# Patient Record
Sex: Male | Born: 1970 | Race: White | Hispanic: No | Marital: Married | Smoking: Never smoker
Health system: Southern US, Community
[De-identification: ages and names within clinical notes are randomized; demographics above are authoritative.]

## PROBLEM LIST (undated history)

## (undated) DIAGNOSIS — D126 Benign neoplasm of colon, unspecified: Secondary | ICD-10-CM

## (undated) DIAGNOSIS — F32A Depression, unspecified: Secondary | ICD-10-CM

## (undated) DIAGNOSIS — F329 Major depressive disorder, single episode, unspecified: Secondary | ICD-10-CM

## (undated) DIAGNOSIS — K4090 Unilateral inguinal hernia, without obstruction or gangrene, not specified as recurrent: Secondary | ICD-10-CM

## (undated) DIAGNOSIS — M1990 Unspecified osteoarthritis, unspecified site: Secondary | ICD-10-CM

## (undated) DIAGNOSIS — F419 Anxiety disorder, unspecified: Secondary | ICD-10-CM

## (undated) DIAGNOSIS — I6783 Posterior reversible encephalopathy syndrome: Principal | ICD-10-CM

## (undated) HISTORY — DX: Posterior reversible encephalopathy syndrome: I67.83

## (undated) HISTORY — DX: Unspecified osteoarthritis, unspecified site: M19.90

## (undated) HISTORY — PX: KNEE ARTHROSCOPY: SUR90

## (undated) HISTORY — DX: Depression, unspecified: F32.A

## (undated) HISTORY — DX: Unilateral inguinal hernia, without obstruction or gangrene, not specified as recurrent: K40.90

## (undated) HISTORY — DX: Benign neoplasm of colon, unspecified: D12.6

## (undated) HISTORY — DX: Anxiety disorder, unspecified: F41.9

## (undated) HISTORY — DX: Major depressive disorder, single episode, unspecified: F32.9

---
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
## 1993-07-21 DIAGNOSIS — Z9889 Other specified postprocedural states: Secondary | ICD-10-CM

## 2004-07-24 ENCOUNTER — Ambulatory Visit: Payer: Self-pay | Admitting: Internal Medicine

## 2004-08-07 ENCOUNTER — Encounter: Admission: RE | Admit: 2004-08-07 | Discharge: 2004-08-07 | Payer: Self-pay | Admitting: Orthopedic Surgery

## 2004-08-08 ENCOUNTER — Ambulatory Visit (HOSPITAL_COMMUNITY): Admission: RE | Admit: 2004-08-08 | Discharge: 2004-08-08 | Payer: Self-pay | Admitting: Orthopedic Surgery

## 2004-08-26 ENCOUNTER — Ambulatory Visit: Payer: Self-pay | Admitting: Internal Medicine

## 2004-09-24 ENCOUNTER — Ambulatory Visit: Payer: Self-pay | Admitting: Internal Medicine

## 2004-09-26 ENCOUNTER — Encounter: Payer: Self-pay | Admitting: Orthopedic Surgery

## 2004-11-28 ENCOUNTER — Ambulatory Visit: Payer: Self-pay | Admitting: Internal Medicine

## 2004-12-20 ENCOUNTER — Ambulatory Visit: Payer: Self-pay | Admitting: Internal Medicine

## 2005-01-22 ENCOUNTER — Ambulatory Visit: Payer: Self-pay | Admitting: Internal Medicine

## 2005-03-25 ENCOUNTER — Ambulatory Visit: Payer: Self-pay | Admitting: Internal Medicine

## 2005-04-01 ENCOUNTER — Ambulatory Visit: Payer: Self-pay | Admitting: Internal Medicine

## 2005-04-03 ENCOUNTER — Ambulatory Visit: Payer: Self-pay | Admitting: Internal Medicine

## 2005-04-03 ENCOUNTER — Emergency Department (HOSPITAL_COMMUNITY): Admission: EM | Admit: 2005-04-03 | Discharge: 2005-04-03 | Payer: Self-pay | Admitting: Emergency Medicine

## 2005-04-08 ENCOUNTER — Ambulatory Visit: Payer: Self-pay | Admitting: Internal Medicine

## 2005-04-15 ENCOUNTER — Ambulatory Visit: Payer: Self-pay | Admitting: Internal Medicine

## 2005-04-22 ENCOUNTER — Ambulatory Visit: Payer: Self-pay | Admitting: Internal Medicine

## 2005-05-28 ENCOUNTER — Ambulatory Visit: Payer: Self-pay | Admitting: Internal Medicine

## 2005-06-10 ENCOUNTER — Ambulatory Visit: Payer: Self-pay | Admitting: Internal Medicine

## 2005-06-24 ENCOUNTER — Ambulatory Visit: Payer: Self-pay | Admitting: Internal Medicine

## 2005-07-08 ENCOUNTER — Ambulatory Visit: Payer: Self-pay | Admitting: Internal Medicine

## 2005-07-23 ENCOUNTER — Ambulatory Visit: Payer: Self-pay | Admitting: Internal Medicine

## 2005-09-18 ENCOUNTER — Ambulatory Visit: Payer: Self-pay | Admitting: Family Medicine

## 2005-10-30 ENCOUNTER — Ambulatory Visit: Payer: Self-pay | Admitting: Internal Medicine

## 2005-12-26 ENCOUNTER — Ambulatory Visit: Payer: Self-pay | Admitting: Internal Medicine

## 2006-01-02 ENCOUNTER — Ambulatory Visit: Payer: Self-pay | Admitting: Internal Medicine

## 2006-01-08 ENCOUNTER — Ambulatory Visit: Payer: Self-pay | Admitting: Internal Medicine

## 2006-01-15 ENCOUNTER — Ambulatory Visit: Payer: Self-pay | Admitting: Internal Medicine

## 2006-02-06 ENCOUNTER — Ambulatory Visit: Payer: Self-pay | Admitting: Internal Medicine

## 2006-03-17 ENCOUNTER — Ambulatory Visit: Payer: Self-pay | Admitting: Internal Medicine

## 2006-04-10 ENCOUNTER — Encounter: Admission: RE | Admit: 2006-04-10 | Discharge: 2006-04-10 | Payer: Self-pay | Admitting: Orthopedic Surgery

## 2006-05-28 ENCOUNTER — Ambulatory Visit: Payer: Self-pay | Admitting: Internal Medicine

## 2006-06-26 ENCOUNTER — Ambulatory Visit: Payer: Self-pay | Admitting: Family Medicine

## 2006-08-04 ENCOUNTER — Encounter: Admission: RE | Admit: 2006-08-04 | Discharge: 2006-08-04 | Payer: Self-pay | Admitting: Orthopedic Surgery

## 2006-09-19 DIAGNOSIS — F3289 Other specified depressive episodes: Secondary | ICD-10-CM | POA: Insufficient documentation

## 2006-09-19 DIAGNOSIS — F329 Major depressive disorder, single episode, unspecified: Secondary | ICD-10-CM

## 2006-09-28 ENCOUNTER — Ambulatory Visit: Payer: Self-pay | Admitting: Internal Medicine

## 2006-10-22 ENCOUNTER — Encounter: Admission: RE | Admit: 2006-10-22 | Discharge: 2006-10-22 | Payer: Self-pay | Admitting: Orthopedic Surgery

## 2006-12-10 ENCOUNTER — Telehealth (INDEPENDENT_AMBULATORY_CARE_PROVIDER_SITE_OTHER): Payer: Self-pay | Admitting: *Deleted

## 2006-12-29 ENCOUNTER — Ambulatory Visit: Payer: Self-pay | Admitting: Family Medicine

## 2006-12-29 DIAGNOSIS — K625 Hemorrhage of anus and rectum: Secondary | ICD-10-CM

## 2007-01-11 ENCOUNTER — Telehealth (INDEPENDENT_AMBULATORY_CARE_PROVIDER_SITE_OTHER): Payer: Self-pay | Admitting: *Deleted

## 2007-01-19 HISTORY — PX: WRIST SURGERY: SHX841

## 2007-01-21 ENCOUNTER — Encounter (INDEPENDENT_AMBULATORY_CARE_PROVIDER_SITE_OTHER): Payer: Self-pay | Admitting: *Deleted

## 2007-02-01 ENCOUNTER — Ambulatory Visit: Payer: Self-pay | Admitting: Internal Medicine

## 2007-02-09 ENCOUNTER — Telehealth (INDEPENDENT_AMBULATORY_CARE_PROVIDER_SITE_OTHER): Payer: Self-pay | Admitting: *Deleted

## 2007-03-19 ENCOUNTER — Ambulatory Visit: Payer: Self-pay | Admitting: Gastroenterology

## 2007-03-19 LAB — CONVERTED CEMR LAB
ALT: 18 units/L (ref 0–53)
AST: 21 units/L (ref 0–37)
Basophils Relative: 0.8 % (ref 0.0–1.0)
Bilirubin, Direct: 0.1 mg/dL (ref 0.0–0.3)
Calcium: 10 mg/dL (ref 8.4–10.5)
Chloride: 103 meq/L (ref 96–112)
Creatinine, Ser: 0.8 mg/dL (ref 0.4–1.5)
Eosinophils Relative: 1.4 % (ref 0.0–5.0)
Glucose, Bld: 100 mg/dL — ABNORMAL HIGH (ref 70–99)
HCT: 40.6 % (ref 39.0–52.0)
Neutrophils Relative %: 48.3 % (ref 43.0–77.0)
Platelets: 354 10*3/uL (ref 150–400)
RBC: 4.53 M/uL (ref 4.22–5.81)
RDW: 12.8 % (ref 11.5–14.6)
Sodium: 140 meq/L (ref 135–145)
TSH: 0.77 microintl units/mL (ref 0.35–5.50)
Total Bilirubin: 0.6 mg/dL (ref 0.3–1.2)
WBC: 7.3 10*3/uL (ref 4.5–10.5)

## 2007-04-23 ENCOUNTER — Ambulatory Visit: Payer: Self-pay | Admitting: Gastroenterology

## 2007-04-23 ENCOUNTER — Encounter (INDEPENDENT_AMBULATORY_CARE_PROVIDER_SITE_OTHER): Payer: Self-pay | Admitting: Internal Medicine

## 2007-04-23 ENCOUNTER — Encounter: Payer: Self-pay | Admitting: Gastroenterology

## 2007-05-03 ENCOUNTER — Encounter: Payer: Self-pay | Admitting: Internal Medicine

## 2007-05-12 ENCOUNTER — Telehealth (INDEPENDENT_AMBULATORY_CARE_PROVIDER_SITE_OTHER): Payer: Self-pay | Admitting: *Deleted

## 2007-07-08 ENCOUNTER — Telehealth (INDEPENDENT_AMBULATORY_CARE_PROVIDER_SITE_OTHER): Payer: Self-pay | Admitting: *Deleted

## 2007-07-30 ENCOUNTER — Emergency Department: Payer: Self-pay | Admitting: Emergency Medicine

## 2007-08-16 ENCOUNTER — Telehealth (INDEPENDENT_AMBULATORY_CARE_PROVIDER_SITE_OTHER): Payer: Self-pay | Admitting: *Deleted

## 2007-09-14 ENCOUNTER — Telehealth (INDEPENDENT_AMBULATORY_CARE_PROVIDER_SITE_OTHER): Payer: Self-pay | Admitting: *Deleted

## 2007-10-14 ENCOUNTER — Telehealth (INDEPENDENT_AMBULATORY_CARE_PROVIDER_SITE_OTHER): Payer: Self-pay | Admitting: *Deleted

## 2007-10-15 DIAGNOSIS — D126 Benign neoplasm of colon, unspecified: Secondary | ICD-10-CM

## 2007-11-15 ENCOUNTER — Telehealth (INDEPENDENT_AMBULATORY_CARE_PROVIDER_SITE_OTHER): Payer: Self-pay | Admitting: *Deleted

## 2007-12-15 ENCOUNTER — Telehealth (INDEPENDENT_AMBULATORY_CARE_PROVIDER_SITE_OTHER): Payer: Self-pay | Admitting: *Deleted

## 2008-02-14 ENCOUNTER — Telehealth (INDEPENDENT_AMBULATORY_CARE_PROVIDER_SITE_OTHER): Payer: Self-pay | Admitting: *Deleted

## 2008-04-14 ENCOUNTER — Telehealth: Payer: Self-pay | Admitting: Internal Medicine

## 2008-04-24 ENCOUNTER — Telehealth: Payer: Self-pay | Admitting: Gastroenterology

## 2008-04-24 ENCOUNTER — Ambulatory Visit: Payer: Self-pay | Admitting: Family Medicine

## 2008-04-25 ENCOUNTER — Ambulatory Visit: Payer: Self-pay | Admitting: Gastroenterology

## 2008-04-25 LAB — CONVERTED CEMR LAB
ALT: 22 units/L (ref 0–53)
Albumin: 4.3 g/dL (ref 3.5–5.2)
Alkaline Phosphatase: 46 units/L (ref 39–117)
Basophils Absolute: 0 10*3/uL (ref 0.0–0.1)
Glucose, Bld: 101 mg/dL — ABNORMAL HIGH (ref 70–99)
HCT: 39.5 % (ref 39.0–52.0)
Hemoglobin: 13.7 g/dL (ref 13.0–17.0)
Lymphocytes Relative: 33.8 % (ref 12.0–46.0)
MCHC: 34.8 g/dL (ref 30.0–36.0)
Monocytes Absolute: 0.5 10*3/uL (ref 0.1–1.0)
Monocytes Relative: 7.9 % (ref 3.0–12.0)
Neutro Abs: 3.6 10*3/uL (ref 1.4–7.7)
Platelets: 330 10*3/uL (ref 150–400)
Potassium: 3.8 meq/L (ref 3.5–5.1)
RDW: 12.8 % (ref 11.5–14.6)
Sodium: 140 meq/L (ref 135–145)
Total Bilirubin: 0.8 mg/dL (ref 0.3–1.2)
Total Protein: 7.3 g/dL (ref 6.0–8.3)

## 2008-05-10 ENCOUNTER — Ambulatory Visit: Payer: Self-pay | Admitting: Internal Medicine

## 2008-05-10 DIAGNOSIS — S335XXA Sprain of ligaments of lumbar spine, initial encounter: Secondary | ICD-10-CM

## 2008-06-08 ENCOUNTER — Telehealth: Payer: Self-pay | Admitting: Internal Medicine

## 2008-07-07 ENCOUNTER — Telehealth: Payer: Self-pay | Admitting: Internal Medicine

## 2008-07-12 ENCOUNTER — Telehealth (INDEPENDENT_AMBULATORY_CARE_PROVIDER_SITE_OTHER): Payer: Self-pay | Admitting: *Deleted

## 2008-07-12 ENCOUNTER — Ambulatory Visit: Payer: Self-pay | Admitting: Family Medicine

## 2008-07-12 DIAGNOSIS — J069 Acute upper respiratory infection, unspecified: Secondary | ICD-10-CM | POA: Insufficient documentation

## 2008-08-16 ENCOUNTER — Ambulatory Visit: Payer: Self-pay | Admitting: Family Medicine

## 2008-08-28 ENCOUNTER — Telehealth: Payer: Self-pay | Admitting: Internal Medicine

## 2008-08-30 ENCOUNTER — Ambulatory Visit: Payer: Self-pay | Admitting: Internal Medicine

## 2008-09-14 ENCOUNTER — Ambulatory Visit: Payer: Self-pay | Admitting: Internal Medicine

## 2008-09-25 ENCOUNTER — Telehealth: Payer: Self-pay | Admitting: Internal Medicine

## 2008-10-06 ENCOUNTER — Ambulatory Visit: Payer: Self-pay | Admitting: Internal Medicine

## 2008-10-17 ENCOUNTER — Telehealth: Payer: Self-pay | Admitting: Internal Medicine

## 2008-10-18 ENCOUNTER — Encounter: Payer: Self-pay | Admitting: Internal Medicine

## 2008-10-19 HISTORY — PX: KNEE ARTHROSCOPY: SUR90

## 2008-10-26 ENCOUNTER — Telehealth: Payer: Self-pay | Admitting: Internal Medicine

## 2008-11-07 ENCOUNTER — Telehealth: Payer: Self-pay | Admitting: Internal Medicine

## 2008-11-07 ENCOUNTER — Ambulatory Visit (HOSPITAL_BASED_OUTPATIENT_CLINIC_OR_DEPARTMENT_OTHER): Admission: RE | Admit: 2008-11-07 | Discharge: 2008-11-07 | Payer: Self-pay | Admitting: Orthopedic Surgery

## 2008-11-13 ENCOUNTER — Telehealth: Payer: Self-pay | Admitting: Internal Medicine

## 2008-11-21 ENCOUNTER — Telehealth: Payer: Self-pay | Admitting: Internal Medicine

## 2008-12-05 ENCOUNTER — Ambulatory Visit: Payer: Self-pay | Admitting: Internal Medicine

## 2008-12-20 ENCOUNTER — Telehealth: Payer: Self-pay | Admitting: Internal Medicine

## 2008-12-21 ENCOUNTER — Telehealth: Payer: Self-pay | Admitting: Internal Medicine

## 2009-01-19 ENCOUNTER — Telehealth: Payer: Self-pay | Admitting: Family Medicine

## 2009-02-02 ENCOUNTER — Telehealth: Payer: Self-pay | Admitting: Internal Medicine

## 2009-02-16 ENCOUNTER — Telehealth: Payer: Self-pay | Admitting: Internal Medicine

## 2009-03-06 ENCOUNTER — Telehealth: Payer: Self-pay | Admitting: Internal Medicine

## 2009-03-19 ENCOUNTER — Telehealth: Payer: Self-pay | Admitting: Internal Medicine

## 2009-04-18 ENCOUNTER — Ambulatory Visit: Payer: Self-pay | Admitting: Internal Medicine

## 2009-04-18 LAB — CONVERTED CEMR LAB: Rapid Strep: NEGATIVE

## 2009-04-19 ENCOUNTER — Telehealth: Payer: Self-pay | Admitting: Internal Medicine

## 2009-05-17 ENCOUNTER — Telehealth: Payer: Self-pay | Admitting: Internal Medicine

## 2009-06-18 ENCOUNTER — Ambulatory Visit: Payer: Self-pay | Admitting: Internal Medicine

## 2009-06-18 DIAGNOSIS — F411 Generalized anxiety disorder: Secondary | ICD-10-CM | POA: Insufficient documentation

## 2009-06-18 DIAGNOSIS — M1990 Unspecified osteoarthritis, unspecified site: Secondary | ICD-10-CM | POA: Insufficient documentation

## 2009-06-27 ENCOUNTER — Telehealth: Payer: Self-pay | Admitting: Internal Medicine

## 2009-07-26 ENCOUNTER — Telehealth: Payer: Self-pay | Admitting: Family Medicine

## 2009-08-24 ENCOUNTER — Telehealth: Payer: Self-pay | Admitting: Family Medicine

## 2009-09-25 ENCOUNTER — Telehealth: Payer: Self-pay | Admitting: Internal Medicine

## 2009-09-26 ENCOUNTER — Encounter: Payer: Self-pay | Admitting: Internal Medicine

## 2009-11-12 ENCOUNTER — Telehealth: Payer: Self-pay | Admitting: Internal Medicine

## 2009-12-12 ENCOUNTER — Telehealth: Payer: Self-pay | Admitting: Internal Medicine

## 2010-01-14 ENCOUNTER — Telehealth: Payer: Self-pay | Admitting: Internal Medicine

## 2010-02-13 ENCOUNTER — Telehealth: Payer: Self-pay | Admitting: Internal Medicine

## 2010-03-15 ENCOUNTER — Telehealth: Payer: Self-pay | Admitting: Family Medicine

## 2010-04-15 ENCOUNTER — Telehealth: Payer: Self-pay | Admitting: Internal Medicine

## 2010-04-22 ENCOUNTER — Ambulatory Visit: Payer: Self-pay | Admitting: Internal Medicine

## 2010-04-22 DIAGNOSIS — R5381 Other malaise: Secondary | ICD-10-CM

## 2010-04-22 DIAGNOSIS — R5383 Other fatigue: Secondary | ICD-10-CM

## 2010-04-24 LAB — CONVERTED CEMR LAB
Albumin: 4.8 g/dL (ref 3.5–5.2)
Alkaline Phosphatase: 48 units/L (ref 39–117)
BUN: 10 mg/dL (ref 6–23)
Basophils Relative: 0.5 % (ref 0.0–3.0)
CO2: 28 meq/L (ref 19–32)
Calcium: 9.8 mg/dL (ref 8.4–10.5)
Chloride: 101 meq/L (ref 96–112)
Eosinophils Absolute: 0.1 10*3/uL (ref 0.0–0.7)
HCT: 43.2 % (ref 39.0–52.0)
Hemoglobin: 15 g/dL (ref 13.0–17.0)
Lymphocytes Relative: 20.2 % (ref 12.0–46.0)
Lymphs Abs: 2.5 10*3/uL (ref 0.7–4.0)
MCHC: 34.8 g/dL (ref 30.0–36.0)
MCV: 90.7 fL (ref 78.0–100.0)
Neutro Abs: 9.2 10*3/uL — ABNORMAL HIGH (ref 1.4–7.7)
Potassium: 4.1 meq/L (ref 3.5–5.1)
RBC: 4.76 M/uL (ref 4.22–5.81)
RDW: 13.7 % (ref 11.5–14.6)
TSH: 0.81 microintl units/mL (ref 0.35–5.50)
Testosterone Free: 56.5 pg/mL (ref 47.0–244.0)
Testosterone: 175.04 ng/dL — ABNORMAL LOW (ref 350–890)
Total Protein: 8 g/dL (ref 6.0–8.3)

## 2010-05-15 ENCOUNTER — Telehealth: Payer: Self-pay | Admitting: Internal Medicine

## 2010-05-23 ENCOUNTER — Ambulatory Visit: Payer: Self-pay | Admitting: Internal Medicine

## 2010-05-30 ENCOUNTER — Telehealth: Payer: Self-pay | Admitting: Internal Medicine

## 2010-06-24 ENCOUNTER — Ambulatory Visit: Payer: Self-pay | Admitting: Internal Medicine

## 2010-07-16 ENCOUNTER — Telehealth (INDEPENDENT_AMBULATORY_CARE_PROVIDER_SITE_OTHER): Payer: Self-pay | Admitting: *Deleted

## 2010-08-07 ENCOUNTER — Telehealth: Payer: Self-pay | Admitting: Internal Medicine

## 2010-08-12 ENCOUNTER — Telehealth: Payer: Self-pay | Admitting: Internal Medicine

## 2010-08-21 NOTE — Assessment & Plan Note (Signed)
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 * Pneamatussin  ?? 2)  Buspar 3)  Effexor 4)  Fluoxetine Hcl (Fluoxetine Hcl) 5)  Zyprexa (Olanzapine)  Past History:  Past medical, surgical, family and social histories (including risk factors) reviewed for relevance to current acute and chronic problems.  Past Medical History: Reviewed history from 06/18/2009 and no changes required. Depression Adenomatous colon polyp Anxiety Osteoarthritis  Past Surgical History: Reviewed history from 12/05/2008 and no changes required. [id number] Inguinal herniorrhaphy-R--1992 R wrist surgery 7/08 Right knee arthroscopy  ~1995 & 2005 Left knee arthroscopy 4/10   Dr Leslee Home  Family History: Reviewed history from 06/18/2009 and no changes required. Biologic dad with prostate cancer Brother with Hodgkin's lymphoma Pat uncle with colon cancer ??Dennie Bible GF died of  MI  Social History: Reviewed history from 06/18/2009 and no changes required. Divorced.---1 son Remarried 8/10---2 stepchildren Plumber now--former Emergency planning/management officer Never Smoked  Review of Systems  The patient denies chest pain, syncope, dyspnea on exertion, and abdominal pain.         No apparent fever weight is up 15# since almost a year ago tries to walk regularly if he has the energy--- still physically active at work No change in bowels Knee pain is about the same--uses knee pads and the hydrocodone  Physical Exam  General:  alert and normal appearance.   Mouth:  no erythema, no exudates, and no lesions.   Neck:  supple, no masses, no thyromegaly, no carotid bruits, and no cervical lymphadenopathy.   Lungs:  normal respiratory effort, no intercostal retractions, no accessory muscle use, and normal breath sounds.   Heart:  normal rate, regular rhythm, no murmur, and no gallop.   Abdomen:  soft, non-tender, no masses, no hepatomegaly, and no splenomegaly.   Msk:  no joint tenderness and no joint swelling.   Pulses:  1+ in feet Extremities:  no sig edema Neurologic:  alert & oriented X3, strength normal in all extremities, and gait normal.   Skin:  no rashes and no suspicious lesions.   Axillary Nodes:  No palpable lymphadenopathy Inguinal Nodes:  No significant adenopathy Psych:  normally interactive, good eye contact, not anxious appearing, and not depressed appearing.     Impression & Recommendations:  Problem # 1:  FATIGUE (ICD-780.79) Assessment New  non specific doesn't appear to be affective but still a possibliity did respond to testosterone in 2004----  may have been placebo effect sleeps enough without apnea PE is reassuring  P: check labs    recheck soon    try to increase walking  Orders: Venipuncture (34742) TLB-Renal Function Panel (80069-RENAL) TLB-CBC Platelet - w/Differential (85025-CBCD) TLB-Hepatic/Liver Function Pnl (80076-HEPATIC) TLB-TSH  (Thyroid Stimulating Hormone) (84443-TSH) Specimen Handling (59563) T- * Misc. Laboratory test [id number])  Complete Medication List: 1)  Lidoderm 5 % Ptch (Lidocaine) .... Apply 1 patch to skin at bedtime 2)  Multivitamins Tabs (Multiple vitamin) .... Take 1 tablet by mouth once a day 3)  Paroxetine Hcl 20 Mg Tabs (Paroxetine hcl) .... One by mouth daily 4)  Hydrocodone-acetaminophen 5-500 Mg Tabs (Hydrocodone-acetaminophen) .Marland Kitchen.. 1-2  three times a day as needed for severe knee pain 5)  Aspirin 81 Mg Tabs (Aspirin) .... Take 1 by mouth once daily 6)  Effexor Xr 75 Mg Xr24h-cap (Venlafaxine hcl) .... Take 1 by mouth two times a day daw1 7)  Vitamin B-12 1000 Mcg Tabs (Cyanocobalamin) .... Take 1 by mouth once daily  Patient Instructions: 1)  Please schedule a follow-up appointment in 1 month.   Current Allergies (reviewed today): ! * PNEAMATUSSIN  ?? BUSPAR EFFEXOR FLUOXETINE HCL (FLUOXETINE HCL) ZYPREXA (OLANZAPINE)

## 2010-08-21 NOTE — Progress Notes (Signed)
Summary: fentanyl patches   Phone Note Call from Patient Call back at Home Phone [phone]   Caller: Patient Call For: Cindee Salt MD Summary of Call: Patient called to let you know that the fentanyl patches seem to be helping, the only problem he is having is he can only get the patches to stay on for about two days when they are supposed to last 3 days. He says that he has tried using medical tape to help hold them on and that doesn't help either. He says that with working outside alot they are just not going to last 3 days each. Please advise.  Uses [facility] if needed.  Initial call taken by: Melody Comas,  May 30, 2010 10:02 AM  Follow-up for Phone Call        we can change the patches to be applied every 2 days  I will write a new prescription which he can pick up Please have him keep the follow up appt Follow-up by: Cindee Salt MD,  May 30, 2010 12:44 PM  Additional Follow-up for Phone Call Additional follow up Details #1::        Spoke with patient and advised rx ready for pick-up  Additional Follow-up by: Mervin Hack CMA [facility]),  May 30, 2010 2:46 PM    New/Updated Medications: FENTANYL 25 MCG/HR PT72 (FENTANYL) Apply 1 patch every 2 days for pain control Prescriptions: FENTANYL 25 MCG/HR PT72 (FENTANYL) Apply 1 patch every 2 days for pain control  #15 x 0   Entered and Authorized by:   Cindee Salt MD   Signed by:   Cindee Salt MD on 05/30/2010   Method used:   Print then Give to Patient   RxID:   [id number]

## 2010-08-21 NOTE — Progress Notes (Signed)
Summary: Hydrocodone/APAP  Phone Note Refill Request Message from:  Fax from Pharmacy on August 24, 2009 10:11 AM  Refills Requested: Medication #1:  HYDROCODONE-ACETAMINOPHEN 5-500 MG TABS 1-2  three times a day as needed for severe knee pain [facility]  Phone:   [phone]   Method Requested: Telephone to Pharmacy Initial call taken by: Delilah Shan CMA [facility]),  August 24, 2009 10:11 AM  Follow-up for Phone Call        Medication phoned to pharmacy.  Follow-up by: Delilah Shan CMA (AAMA),  August 24, 2009 11:11 AM    Prescriptions: HYDROCODONE-ACETAMINOPHEN 5-500 MG TABS (HYDROCODONE-ACETAMINOPHEN) 1-2  three times a day as needed for severe knee pain  #90 x 1   Entered and Authorized by:   Ruthe Mannan MD   Signed by:   Ruthe Mannan MD on 08/24/2009   Method used:   Handwritten   RxID:   [id number]

## 2010-08-21 NOTE — Progress Notes (Signed)
Summary: refill request for vicodin  Phone Note Refill Request Message from:  Fax from Pharmacy  Refills Requested: Medication #1:  HYDROCODONE-ACETAMINOPHEN 5-500 MG TABS 1-2  three times a day as needed for severe knee pain   Last Refilled: 11/12/2009 Faxed request from [facility] is on your desk.  Initial call taken by: Lowella Petties CMA,  Dec 12, 2009 8:48 AM  Follow-up for Phone Call        okay #135 x 0 Follow-up by: Cindee Salt MD,  Dec 12, 2009 1:50 PM  Additional Follow-up for Phone Call Additional follow up Details #1::        Rx faxed to pharmacy Additional Follow-up by: DeShannon Smith CMA [facility]),  Dec 12, 2009 4:21 PM    Prescriptions: HYDROCODONE-ACETAMINOPHEN 5-500 MG TABS (HYDROCODONE-ACETAMINOPHEN) 1-2  three times a day as needed for severe knee pain  #135 x 0   Entered by:   Mervin Hack CMA (AAMA)   Authorized by:   Cindee Salt MD   Signed by:   Mervin Hack CMA (AAMA) on 12/12/2009   Method used:   Handwritten   RxID:   [id number]

## 2010-08-21 NOTE — Progress Notes (Signed)
Summary: vicodin   Phone Note Refill Request Message from:  Fax from Pharmacy on April 15, 2010 8:34 AM  Refills Requested: Medication #1:  HYDROCODONE-ACETAMINOPHEN 5-500 MG TABS 1-2  three times a day as needed for severe knee pain   Last Refilled: 03/15/2010 Refill request from [facility]. Form is on your desk.   Initial call taken by: Melody Comas,  April 15, 2010 8:34 AM  Follow-up for Phone Call        Okay #135 x 0 Follow-up by: Cindee Salt MD,  April 15, 2010 1:32 PM  Additional Follow-up for Phone Call Additional follow up Details #1::        Completed rx form faxed back to pharmacy. Additional Follow-up by: Sydell Axon LPN,  April 15, 2010 2:44 PM    Prescriptions: HYDROCODONE-ACETAMINOPHEN 5-500 MG TABS (HYDROCODONE-ACETAMINOPHEN) 1-2  three times a day as needed for severe knee pain  #135 x 0   Entered by:   Sydell Axon LPN   Authorized by:   Cindee Salt MD   Signed by:   Sydell Axon LPN on 64/40/3474   Method used:   Telephoned to ...       [facility]* (retail)       [address]       [place], [place]  [address]       Ph: [phone]       Fax: [phone]   RxID:   [id number]

## 2010-08-21 NOTE — Progress Notes (Signed)
Summary: hydrcodone  Phone Note Refill Request Message from:  Fax from Pharmacy on May 15, 2010 8:55 AM  Refills Requested: Medication #1:  HYDROCODONE-ACETAMINOPHEN 5-500 MG TABS 1-2  three times a day as needed for severe knee pain   Last Refilled: 04/15/2010 Refill request from [facility]. [phone].  Initial call taken by: Melody Comas,  May 15, 2010 8:56 AM  Follow-up for Phone Call        okay #135 x 0 Follow-up by: Cindee Salt MD,  May 15, 2010 1:44 PM  Additional Follow-up for Phone Call Additional follow up Details #1::        Rx called to pharmacy Additional Follow-up by: DeShannon Smith CMA [facility]),  May 15, 2010 3:12 PM    Prescriptions: HYDROCODONE-ACETAMINOPHEN 5-500 MG TABS (HYDROCODONE-ACETAMINOPHEN) 1-2  three times a day as needed for severe knee pain  #135 x 0   Entered by:   Mervin Hack CMA (AAMA)   Authorized by:   Cindee Salt MD   Signed by:   Mervin Hack CMA (AAMA) on 05/15/2010   Method used:   Telephoned to ...       [facility]* (retail)       [address]       [place], [place]  [address]       Ph: [phone]       Fax: [phone]   RxID:   [id number]

## 2010-08-21 NOTE — Progress Notes (Signed)
Summary: Rx Hydrocodone  Phone Note Refill Request Call back at [phone] Message from:  [facility] on November 12, 2009 9:37 AM  Refills Requested: Medication #1:  HYDROCODONE-ACETAMINOPHEN 5-500 MG TABS 1-2  three times a day as needed for severe knee pain   Last Refilled: 10/11/2009 Received faxed refill request, form in your IN box.   Method Requested: Fax to Local Pharmacy Initial call taken by: Linde Gillis CMA [facility]),  November 12, 2009 9:38 AM  Follow-up for Phone Call        okay #135 x 0 Follow-up by: Cindee Salt MD,  November 12, 2009 1:08 PM  Additional Follow-up for Phone Call Additional follow up Details #1::        Rx faxed to pharmacy Additional Follow-up by: DeShannon Smith CMA [facility]),  November 12, 2009 2:17 PM    Prescriptions: HYDROCODONE-ACETAMINOPHEN 5-500 MG TABS (HYDROCODONE-ACETAMINOPHEN) 1-2  three times a day as needed for severe knee pain  #135 x 0   Entered by:   Mervin Hack CMA (AAMA)   Authorized by:   Cindee Salt MD   Signed by:   Mervin Hack CMA (AAMA) on 11/12/2009   Method used:   Handwritten   RxID:   [id number]

## 2010-08-21 NOTE — Assessment & Plan Note (Signed)
Summary: 1 M F/U DLO   Vital Signs:  Patient profile:   39 year old male Weight:      236 pounds Temp:     98.3 degrees F oral BP sitting:   118 / 60  (left arm) Cuff size:   large  Vitals Entered By: Mervin Hack CMA [facility]) (June 24, 2010 4:05 PM) CC: 1 month follow-up   History of Present Illness: patch is really helping Burden of pain is down considerably--"I can deal with it better" Has decreased the hydrocodone to  ~ 2 per day Tends to take 1/2 at various times  No sedation No stomach trouble Slight nausea at first when started--- realized it was from the vitamin supplements   Allergies: 1)  ! * Pneamatussin  ?? 2)  Buspar 3)  Effexor 4)  Fluoxetine Hcl (Fluoxetine Hcl) 5)  Zyprexa (Olanzapine)  Past History:  Past medical, surgical, family and social histories (including risk factors) reviewed for relevance to current acute and chronic problems.  Past Medical History: Reviewed history from 06/18/2009 and no changes required. Depression Adenomatous colon polyp Anxiety Osteoarthritis  Past Surgical History: Reviewed history from 12/05/2008 and no changes required. [id number] Inguinal herniorrhaphy-R--1992 R wrist surgery 7/08 Right knee arthroscopy  ~1995 & 2005 Left knee arthroscopy 4/10   Dr Leslee Home  Family History: Reviewed history from 06/18/2009 and no changes required. Biologic dad with prostate cancer Brother with Hodgkin's lymphoma Pat uncle with colon cancer ??Dennie Bible GF died of MI  Social History: Reviewed history from 06/18/2009 and no changes required. 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 Apply 1 patch every 2 days for pain control    Hydrocodone-acetaminophen 5-500 Mg Tabs (Hydrocodone-acetaminophen) .Marland Kitchen... 1  three times a day as needed for severe knee pain    Aspirin 81 Mg Tabs (Aspirin) .Marland Kitchen... Take 1 by mouth once daily  Complete Medication List: 1)  Fentanyl 25 Mcg/hr Pt72 (Fentanyl) .... Apply 1 patch every 2 days for pain control 2)  Paroxetine Hcl 20 Mg Tabs (Paroxetine hcl) .... One by mouth daily 3)  Hydrocodone-acetaminophen 5-500 Mg Tabs (Hydrocodone-acetaminophen) .Marland Kitchen.. 1  three times a day as needed for severe knee pain 4)  Effexor Xr 75 Mg Xr24h-cap (Venlafaxine hcl) .... Take 1 by mouth two times a day daw1 5)  Lidoderm 5 % Ptch (Lidocaine) .... Apply 1 patch to skin at bedtime 6)  Aspirin 81 Mg Tabs (Aspirin) .... Take 1 by mouth once daily 7)  Vitamin B-12 1000 Mcg Tabs (Cyanocobalamin) .... Take 1 by mouth once daily  Patient Instructions: 1)  Please schedule a follow-up appointment in 4-6  months for physical Prescriptions: HYDROCODONE-ACETAMINOPHEN 5-500 MG TABS (HYDROCODONE-ACETAMINOPHEN) 1  three times a day as needed for severe knee pain  #90 x 0   Entered and Authorized by:   Cindee Salt MD   Signed by:   Cindee Salt MD on 06/24/2010   Method used:   Print then Give to Patient   RxID:  [id number] FENTANYL 25 MCG/HR PT72 (FENTANYL) Apply 1 patch every 2 days for pain control  #15 x 0   Entered and Authorized by:   Cindee Salt MD   Signed by:   Cindee Salt MD on 06/24/2010   Method used:   Print then Give to Patient   RxID:   [id number] EFFEXOR XR 75 MG XR24H-CAP (VENLAFAXINE HCL) take 1 by mouth two times a day DAW1  #60 x 12   Entered by:   Mervin Hack CMA (AAMA)   Authorized by:   Cindee Salt MD   Signed by:    Mervin Hack CMA (AAMA) on 06/24/2010   Method used:   Electronically to        [facility]* (retail)       [address]       [place], [place]  [address]       Ph: [phone]       Fax: [phone]   RxID:   [id number] PAROXETINE HCL 20 MG  TABS (PAROXETINE HCL) one by mouth daily  #30 x 12   Entered by:   Mervin Hack CMA (AAMA)   Authorized by:   Cindee Salt MD   Signed by:   Mervin Hack CMA (AAMA) on 06/24/2010   Method used:   Electronically to        [facility]* (retail)       [address]       [place], [place]  [address]       Ph: [phone]       Fax: [phone]   RxID:   [id number]    Orders Added: 1)  Est. Patient Level III [83151]    Current Allergies (reviewed today): ! * PNEAMATUSSIN  ?? BUSPAR EFFEXOR FLUOXETINE HCL (FLUOXETINE HCL) ZYPREXA (OLANZAPINE)

## 2010-08-21 NOTE — Progress Notes (Signed)
Summary: Hydrocodone/APAP  Phone Note Refill Request Message from:  Fax from Pharmacy on January 14, 2010 9:38 AM  Refills Requested: Medication #1:  HYDROCODONE-ACETAMINOPHEN 5-500 MG TABS 1-2  three times a day as needed for severe knee pain [facility]  Phone:   [phone]   Method Requested: Telephone to Pharmacy Initial call taken by: Delilah Shan CMA [facility]),  January 14, 2010 9:39 AM  Follow-up for Phone Call        okay #135 x 0 Follow-up by: Cindee Salt MD,  January 14, 2010 1:44 PM  Additional Follow-up for Phone Call Additional follow up Details #1::        Rx called to pharmacy Additional Follow-up by: DeShannon Katrinka Blazing CMA [facility]),  January 14, 2010 2:36 PM    Prescriptions: HYDROCODONE-ACETAMINOPHEN 5-500 MG TABS (HYDROCODONE-ACETAMINOPHEN) 1-2  three times a day as needed for severe knee pain  #135 x 0   Entered by:   Mervin Hack CMA (AAMA)   Authorized by:   Cindee Salt MD   Signed by:   Mervin Hack CMA (AAMA) on 01/14/2010   Method used:   Telephoned to ...       [facility]* (retail)       [address]       [place], [place]  [address]       Ph: [phone]       Fax: [phone]   RxID:   [id number]

## 2010-08-21 NOTE — Assessment & Plan Note (Signed)
Summary: ONE MONTH FOLLOW UP / LFW   Vital Signs:  Patient profile:   40 year old male Height:      72 inches Weight:      240.50 pounds BMI:     32.74 Temp:     97.8 degrees F oral Pulse rate:   100 / minute Pulse rhythm:   regular BP sitting:   112 / 76  (left arm) Cuff size:   large  Vitals Entered By: Lewanda Rife LPN (May 23, 2010 2:36 PM) CC: one month f/u   History of Present Illness: Feels about the same Tired all the time but he keeps up the pace  Still sleeps okay Mood has been okay--no major problems with anxiety not depressed  Feels tired but not really sleepy  will try to walk or other exercise on days off this no longer energizes him  Ongoing knee pain can get pretty bad as the day goes on hydrocodone gives relief for a while but wears off   Allergies: 1)  ! * Pneamatussin  ?? 2)  Buspar 3)  Effexor 4)  Fluoxetine Hcl (Fluoxetine Hcl) 5)  Zyprexa (Olanzapine)  Past History:  Past medical, surgical, family and social histories (including risk factors) reviewed for relevance to current acute and chronic problems.  Past Medical History: Reviewed history from 06/18/2009 and no changes required. Depression Adenomatous colon polyp Anxiety Osteoarthritis  Past Surgical History: Reviewed history from 12/05/2008 and no changes required. [id number] Inguinal herniorrhaphy-R--1992 R wrist surgery 7/08 Right knee arthroscopy  ~1995 & 2005 Left knee arthroscopy 4/10   Dr Leslee Home  Family History: Reviewed history from 06/18/2009 and no changes required. Biologic dad with prostate cancer Brother with Hodgkin's lymphoma Pat uncle with colon cancer ??Dennie Bible GF died of MI  Social History: Reviewed history from 06/18/2009 and no changes required. 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 One by mouth daily 2)  Hydrocodone-acetaminophen 5-500 Mg Tabs (Hydrocodone-acetaminophen) .Marland Kitchen.. 1-2  three times a day as needed for severe knee pain 3)  Effexor Xr 75 Mg Xr24h-cap (Venlafaxine hcl) .... Take 1 by mouth two times a day daw1 4)  Lidoderm 5 % Ptch (Lidocaine) .... Apply 1 patch to skin at bedtime 5)  Aspirin 81 Mg Tabs (Aspirin) .... Take 1 by mouth once daily 6)  Vitamin B-12 1000 Mcg Tabs (Cyanocobalamin) .... Take 1 by mouth once daily 7)  Multivitamins Tabs (Multiple vitamin) .... Take 1 tablet by mouth once a day 8)  Fentanyl 25 Mcg/hr Pt72 (Fentanyl) .... Apply 1 patch every 3 days for pain control  Other Orders: Flu Vaccine 81yrs + (14782) Admin 1st Vaccine (95621)  Patient Instructions: 1)  Please schedule a follow-up appointment in 1 month.  Prescriptions: FENTANYL 25 MCG/HR PT72 (FENTANYL) Apply 1 patch every 3 days for pain control  #10 x 0   Entered and Authorized by:   Cindee Salt MD   Signed by:  Richard Dia Crawford MD on 05/23/2010   Method used:   Print then Give to Patient   RxID:   [id number]    Orders Added: 1)  Flu Vaccine 47yrs + [36644] 2)  Admin 1st Vaccine [90471]   Immunizations Administered:  Influenza Vaccine # 1:    Vaccine Type: Fluvax 3+    Site: left deltoid    Mfr: GlaxoSmithKline    Dose: 0.5 ml    Route: IM    Given by: Mervin Hack CMA (AAMA)    Exp. Date: 01/18/2011    Lot #: [id number]    VIS given: 02/12/10 version given May 23, 2010.  Flu Vaccine Consent Questions:    Do you  have a history of severe allergic reactions to this vaccine? no    Any prior history of allergic reactions to egg and/or gelatin? no    Do you have a sensitivity to the preservative Thimersol? no    Do you have a past history of Guillan-Barre Syndrome? no    Do you currently have an acute febrile illness? no    Have you ever had a severe reaction to latex? no    Vaccine information given and explained to patient? yes   Immunizations Administered:  Influenza Vaccine # 1:    Vaccine Type: Fluvax 3+    Site: left deltoid    Mfr: GlaxoSmithKline    Dose: 0.5 ml    Route: IM    Given by: Mervin Hack CMA (AAMA)    Exp. Date: 01/18/2011    Lot #: [id number]    VIS given: 02/12/10 version given May 23, 2010.  Current Allergies (reviewed today): ! * PNEAMATUSSIN  ?? BUSPAR EFFEXOR FLUOXETINE HCL (FLUOXETINE HCL) ZYPREXA (OLANZAPINE)

## 2010-08-21 NOTE — Progress Notes (Signed)
Summary: refill request for vicodin  Phone Note Refill Request Call back at Home Phone [phone] Message from:  Patient  Refills Requested: Medication #1:  HYDROCODONE-ACETAMINOPHEN 5-500 MG TABS 1-2  three times a day as needed for severe knee pain Phoned request from pt, please send to [facility], [phone].  Initial call taken by: Lowella Petties CMA,  September 25, 2009 12:23 PM  Follow-up for Phone Call        I typically do not use this much narcotics in a 40 year old with knee pain.  Ongoing Orthopedic assessment, patient needs definitive management for any internal derangement.  defer to Dr. Alphonsus Sias when he returns Follow-up by: Hannah Beat MD,  September 25, 2009 1:05 PM  Additional Follow-up for Phone Call Additional follow up Details #1::        spoke with pharmacy, pt got rx and fill on 08/24/2009 and went back on 09/08/2009 and got refill. He was given 90x1 which was only a 15day supply when taken 2 by mouth three times a day. left message on machine for patient to return call to see if he's in more pain. 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 Pt will wait to hear from Dr. Alphonsus Sias. Melody Comas  September 25, 2009 5:09 PM   Please call him Based on our discussions, he has been using  ~4 daily Obviously with his fil history, he has increased his dose quite a bit Okay to fill #90 x 0 If he needs this refilled sooner than 3 weeks, he will need to set up an appt Additional Follow-up by: Cindee Salt MD,  September 26, 2009 11:30 AM    Additional Follow-up for Phone Call Additional follow up Details #2::    rx called in to pharmacy, spoke with pt and advised that he needs follow-up to discuss usage of med, pt states he will call for appt. Follow-up by: Mervin Hack CMA Duncan Dull),  September 26, 2009  1:09 PM  Prescriptions: HYDROCODONE-ACETAMINOPHEN 5-500 MG TABS (HYDROCODONE-ACETAMINOPHEN) 1-2  three times a day as needed for severe knee pain  #90 x 0   Entered by:   Mervin Hack CMA (AAMA)   Authorized by:   Cindee Salt MD   Signed by:   Mervin Hack CMA (AAMA) on 09/26/2009   Method used:   Telephoned to ...       [facility]* (retail)       [address]       [place], [place]  [address]       Ph: [phone]       Fax: [phone]   RxID:   [id number]

## 2010-08-21 NOTE — Progress Notes (Signed)
Summary: refill request for vicodin  Phone Note Refill Request Message from:  Fax from Pharmacy  Refills Requested: Medication #1:  HYDROCODONE-ACETAMINOPHEN 5-500 MG TABS 1-2  three times a day as needed for severe knee pain   Last Refilled: 01/14/2010 Faxed request from [facility] is on your desk.  Initial call taken by: Lowella Petties CMA,  February 13, 2010 10:02 AM  Follow-up for Phone Call        okay #135 x 0 Follow-up by: Cindee Salt MD,  February 13, 2010 1:56 PM  Additional Follow-up for Phone Call Additional follow up Details #1::        Called into [facility] as directed Additional Follow-up by: Janee Morn CMA,  February 13, 2010 2:07 PM    Prescriptions: HYDROCODONE-ACETAMINOPHEN 5-500 MG TABS (HYDROCODONE-ACETAMINOPHEN) 1-2  three times a day as needed for severe knee pain  #135 x 0   Entered by:   Janee Morn CMA   Authorized by:   Cindee Salt MD   Signed by:   Janee Morn CMA on 02/13/2010   Method used:   Telephoned to ...       [facility]* (retail)       [address]       [place], [place]  [address]       Ph: [phone]       Fax: [phone]   RxID:   [id number]

## 2010-08-21 NOTE — Miscellaneous (Signed)
Summary: Controlled Substance Agreement  Controlled Substance Agreement   Imported By: Lanelle Bal 04/29/2010 09:07:32  _____________________________________________________________________  External Attachment:    Type:   Image     Comment:   External Document

## 2010-08-21 NOTE — Letter (Signed)
Summary: Letter from pt. to Dr.Tirth Cothron-re: refills  Letter from pt. to Dr.Shantel Helwig-re: refills   Imported By: Beau Fanny 10/01/2009 15:09:16  _____________________________________________________________________  External Attachment:    Type:   Image     Comment:   External Document  Appended Document: Letter from pt. to Dr.Almendra Loria-re: refills Please call him I understand now and 4 & 1/2 is what we agreed upon daily With next Rx, I will start giving 135 tabs which would be a 30 day supply He has had appropriate ortho evaluations and multiple procedures so using the pain meds is our only alternative for now  Appended Document: Letter from pt. to Dr.Ilisa Hayworth-re: refills Spoke with patient and advised results, pt understands.

## 2010-08-21 NOTE — Progress Notes (Signed)
Summary: Rx Hydrocodone/APAP  Phone Note Refill Request Call back at [phone] Message from:  [facility] on July 26, 2009 9:54 AM  Refills Requested: Medication #1:  HYDROCODONE-ACETAMINOPHEN 5-500 MG TABS 1-2  three times a day as needed for severe knee pain   Last Refilled: 07/11/2009 Received faxed refill request, please adivse   Method Requested: Telephone to Pharmacy Initial call taken by: Linde Gillis CMA Duncan Dull),  July 26, 2009 9:54 AM  Follow-up for Phone Call        On my desk for pick up. Follow-up by: Ruthe Mannan MD,  July 26, 2009 10:46 AM    Prescriptions: HYDROCODONE-ACETAMINOPHEN 5-500 MG TABS (HYDROCODONE-ACETAMINOPHEN) 1-2  three times a day as needed for severe knee pain  #90 x 1   Entered and Authorized by:   Ruthe Mannan MD   Signed by:   Ruthe Mannan MD on 07/26/2009   Method used:   Printed then faxed to ...       [facility]* (retail)       [address]       [place], [place]  [address]       Ph: [phone]       Fax: [phone]   RxID:   [id number]   Appended Document: Rx Hydrocodone/APAP Patient notified, Rx will be left at front desk for pick up.

## 2010-08-21 NOTE — Progress Notes (Signed)
Summary: refill request for vicodin  Phone Note Refill Request Message from:  Fax from Pharmacy  Refills Requested: Medication #1:  HYDROCODONE-ACETAMINOPHEN 5-500 MG TABS 1-2  three times a day as needed for severe knee pain   Last Refilled: 02/13/2010 Faxed request from [facility], [phone].  Initial call taken by: Lowella Petties CMA,  March 15, 2010 8:12 AM    Prescriptions: HYDROCODONE-ACETAMINOPHEN 5-500 MG TABS (HYDROCODONE-ACETAMINOPHEN) 1-2  three times a day as needed for severe knee pain  #135 x 0   Entered and Authorized by:   Ruthe Mannan MD   Signed by:   Ruthe Mannan MD on 03/15/2010   Method used:   Print then Give to Patient   RxID:   [id number]   Appended Document: refill request for vicodin Rx called to pharmacy.

## 2010-08-22 NOTE — Progress Notes (Signed)
Summary: fentanyl not working as well  Phone Note Call from Patient Call back at [facility] [phone]   Caller: Patient Call For: Cindee Salt MD Summary of Call: Pt was recently put on fentanyl patches.  He was told to call and report it they started being less effective.  Pt states they are not working as well as at first. He is having to take an extra vicodin every day, and he says he really doesnt want to do that.  He is asking if you want to increase the dose on patches. Initial call taken by: Lowella Petties CMA, AAMA,  July 16, 2010 9:50 AM  Follow-up for Phone Call        yes---we will have him add a 12 micrograms patch to the 25 micrograms patch for now. he should use both at the same time for increased dose Follow-up by: Cindee Salt MD,  July 16, 2010 2:03 PM  Additional Follow-up for Phone Call Additional follow up Details #1::        Patient Advised. Prescription left at front desk.  Additional Follow-up by: Delilah Shan CMA Duncan Dull),  July 16, 2010 3:58 PM    New/Updated Medications: FENTANYL 12 MCG/HR PT72 (FENTANYL) 1 patch along with 25 micrograms patch every 2 days Prescriptions: FENTANYL 12 MCG/HR PT72 (FENTANYL) 1 patch along with 25 micrograms patch every 2 days  #15 x 0   Entered and Authorized by:   Cindee Salt MD   Signed by:   Cindee Salt MD on 07/16/2010   Method used:   Print then Give to Patient   RxID:   [id number]

## 2010-08-22 NOTE — Progress Notes (Signed)
Summary: fentanyl  Phone Note Refill Request Call back at Home Phone [phone] Message from:  Patient on August 12, 2010 10:12 AM  Refills Requested: Medication #1:  FENTANYL 12 MCG/HR PT72 1 patch along with 25 micrograms patch every 2 days. Patient is asking if he can get this rx to where it runs out at the same time as the fentanyl 25. He says that it would be more convenient to have them on the same schedule.   Initial call taken by: Melody Comas,  August 12, 2010 10:14 AM  Follow-up for Phone Call        Only way to do that is to give 2 less so he will be due for both  ~2/18 Follow-up by: Cindee Salt MD,  August 12, 2010 1:44 PM  Additional Follow-up for Phone Call Additional follow up Details #1::        Spoke with patient and advised rx ready for pick-up  Additional Follow-up by: Mervin Hack CMA Duncan Dull),  August 12, 2010 2:18 PM    Prescriptions: FENTANYL 12 MCG/HR PT72 (FENTANYL) 1 patch along with 25 micrograms patch every 2 days  #13 x 0   Entered and Authorized by:   Cindee Salt MD   Signed by:   Cindee Salt MD on 08/12/2010   Method used:   Print then Give to Patient   RxID:   [id number]

## 2010-08-22 NOTE — Progress Notes (Signed)
Summary: fentanyl   Phone Note Refill Request Call back at Home Phone [phone] Message from:  Fax from Pharmacy on August 07, 2010 3:41 PM  Refills Requested: Medication #1:  FENTANYL 25 MCG/HR PT72 Apply 1 patch every 2 days for pain control Please call patient when ready.    Method Requested: Pick up at Office Initial call taken by: Melody Comas,  August 07, 2010 3:41 PM  Follow-up for Phone Call        Rx written Follow-up by: Cindee Salt MD,  August 08, 2010 12:56 PM  Additional Follow-up for Phone Call Additional follow up Details #1::        Spoke with patient and advised rx ready for pick-up  Additional Follow-up by: Mervin Hack CMA Duncan Dull),  August 08, 2010 3:04 PM    Prescriptions: FENTANYL 25 MCG/HR PT72 (FENTANYL) Apply 1 patch every 2 days for pain control  #15 x 0   Entered and Authorized by:   Cindee Salt MD   Signed by:   Cindee Salt MD on 08/08/2010   Method used:   Print then Give to Patient   RxID:   [id number]

## 2010-08-22 NOTE — Progress Notes (Signed)
Summary: hydrocodone  Phone Note Refill Request Message from:  Fax from Pharmacy on August 07, 2010 10:46 AM  Refills Requested: Medication #1:  HYDROCODONE-ACETAMINOPHEN 5-500 MG TABS 1  three times a day as needed for severe knee pain   Last Refilled: 06/24/2010 Refill reques from [facility]. [phone]. Fax is on your desk.   Initial call taken by: Melody Comas,  August 07, 2010 10:46 AM  Follow-up for Phone Call        Okay #90 x 0 Follow-up by: Cindee Salt MD,  August 07, 2010 1:46 PM  Additional Follow-up for Phone Call Additional follow up Details #1::        Rx faxed to pharmacy Additional Follow-up by: DeShannon Smith CMA Duncan Dull),  August 07, 2010 2:59 PM    Prescriptions: HYDROCODONE-ACETAMINOPHEN 5-500 MG TABS (HYDROCODONE-ACETAMINOPHEN) 1  three times a day as needed for severe knee pain  #90 x 0   Entered by:   Mervin Hack CMA (AAMA)   Authorized by:   Cindee Salt MD   Signed by:   Mervin Hack CMA (AAMA) on 08/07/2010   Method used:   Handwritten   RxID:   [id number]

## 2010-09-06 ENCOUNTER — Telehealth: Payer: Self-pay | Admitting: Internal Medicine

## 2010-09-11 NOTE — Progress Notes (Signed)
Summary: Medication refill  Phone Note Refill Request   Refills Requested: Medication #1:  FENTANYL 25 MCG/HR PT72 Apply 1 patch every 2 days for pain control  Medication #2:  LIDODERM 5 % PTCH Apply 1 patch to skin at bedtime Summary of Call: Pt called need medication called in, says it was 2 patches that is refilled together-[place] and Fentanyl.  [facility] Daine Gip  September 06, 2010 12:21 PM  Initial call taken by: Daine Gip,  September 06, 2010 12:20 PM  Follow-up for Phone Call        Spoke with patient and advised rx ready for pick-up  Follow-up by: Mervin Hack CMA Duncan Dull),  September 06, 2010 2:56 PM    New/Updated Medications: LIDODERM 5 % PTCH (LIDOCAINE) Apply 1 patch to skin as directed. May leave on for up to 12 hours Prescriptions: LIDODERM 5 % PTCH (LIDOCAINE) Apply 1 patch to skin as directed. May leave on for up to 12 hours  #30 x 5   Entered and Authorized by:   Cindee Salt MD   Signed by:   Cindee Salt MD on 09/06/2010   Method used:   Print then Give to Patient   RxID:   [id number] FENTANYL 12 MCG/HR PT72 (FENTANYL) 1 patch along with 25 micrograms patch every 2 days  #15 x 0   Entered and Authorized by:   Cindee Salt MD   Signed by:   Cindee Salt MD on 09/06/2010   Method used:   Print then Give to Patient   RxID:   [id number] FENTANYL 25 MCG/HR PT72 (FENTANYL) Apply 1 patch every 2 days for pain control  #15 x 0   Entered and Authorized by:   Cindee Salt MD   Signed by:   Cindee Salt MD on 09/06/2010   Method used:   Print then Give to Patient   RxID:   [id number]

## 2010-09-18 ENCOUNTER — Encounter: Payer: Self-pay | Admitting: Internal Medicine

## 2010-09-18 DIAGNOSIS — F329 Major depressive disorder, single episode, unspecified: Secondary | ICD-10-CM | POA: Insufficient documentation

## 2010-09-18 DIAGNOSIS — F419 Anxiety disorder, unspecified: Secondary | ICD-10-CM | POA: Insufficient documentation

## 2010-09-18 DIAGNOSIS — D126 Benign neoplasm of colon, unspecified: Secondary | ICD-10-CM

## 2010-09-18 DIAGNOSIS — F32A Depression, unspecified: Secondary | ICD-10-CM

## 2010-09-18 DIAGNOSIS — M1990 Unspecified osteoarthritis, unspecified site: Secondary | ICD-10-CM

## 2010-09-23 ENCOUNTER — Telehealth: Payer: Self-pay | Admitting: Family Medicine

## 2010-10-01 NOTE — Progress Notes (Signed)
Summary: hydrocodone  Phone Note Refill Request Message from:  Fax from Pharmacy on September 23, 2010 11:00 AM  Refills Requested: Medication #1:  HYDROCODONE-ACETAMINOPHEN 5-500 MG TABS 1  three times a day as needed for severe knee pain   Last Refilled: 08/07/2010 Refill request from [facility]. [phone].   Initial call taken by: Melody Comas,  September 23, 2010 11:00 AM  Follow-up for Phone Call        Rx called to pharmacy Follow-up by: Mervin Hack CMA Duncan Dull),  September 23, 2010 1:59 PM    Prescriptions: HYDROCODONE-ACETAMINOPHEN 5-500 MG TABS (HYDROCODONE-ACETAMINOPHEN) 1  three times a day as needed for severe knee pain  #90 x 0   Entered and Authorized by:   Ruthe Mannan MD   Signed by:   Ruthe Mannan MD on 09/23/2010   Method used:   Telephoned to ...       [facility]* (retail)       [address]       [place], [place]  [address]       Ph: [phone]       Fax: [phone]   RxID:   [id number]

## 2010-10-08 ENCOUNTER — Other Ambulatory Visit: Payer: Self-pay | Admitting: *Deleted

## 2010-10-08 ENCOUNTER — Other Ambulatory Visit: Payer: Self-pay | Admitting: Internal Medicine

## 2010-10-08 ENCOUNTER — Encounter: Payer: Self-pay | Admitting: Internal Medicine

## 2010-10-08 DIAGNOSIS — M170 Bilateral primary osteoarthritis of knee: Secondary | ICD-10-CM

## 2010-10-08 MED ORDER — FENTANYL 25 MCG/HR TD PT72: 1.0000 | MEDICATED_PATCH | TRANSDERMAL | Status: DC

## 2010-10-08 MED ORDER — FENTANYL 12 MCG/HR TD PT72: 10.0000 | MEDICATED_PATCH | TRANSDERMAL | Status: DC

## 2010-10-08 NOTE — Telephone Encounter (Signed)
Pt requests refills on fentanyl patches, 25 and 12 mcg's.  Please call when ready.

## 2010-10-08 NOTE — Telephone Encounter (Signed)
Rx written Please notify

## 2010-11-07 ENCOUNTER — Encounter: Payer: Self-pay | Admitting: Internal Medicine

## 2010-11-07 ENCOUNTER — Ambulatory Visit (INDEPENDENT_AMBULATORY_CARE_PROVIDER_SITE_OTHER): Payer: Managed Care, Other (non HMO) | Admitting: Internal Medicine

## 2010-11-07 VITALS — BP 120/80 | HR 90 | Temp 98.1°F | Ht 72.0 in | Wt 223.0 lb

## 2010-11-07 DIAGNOSIS — F329 Major depressive disorder, single episode, unspecified: Secondary | ICD-10-CM

## 2010-11-07 DIAGNOSIS — F411 Generalized anxiety disorder: Secondary | ICD-10-CM

## 2010-11-07 DIAGNOSIS — E785 Hyperlipidemia, unspecified: Secondary | ICD-10-CM

## 2010-11-07 DIAGNOSIS — Z0000 Encounter for general adult medical examination without abnormal findings: Secondary | ICD-10-CM

## 2010-11-07 DIAGNOSIS — M1990 Unspecified osteoarthritis, unspecified site: Secondary | ICD-10-CM

## 2010-11-07 MED ORDER — FENTANYL 12 MCG/HR TD PT72: MEDICATED_PATCH | TRANSDERMAL | Status: DC

## 2010-11-07 MED ORDER — HYDROCODONE-ACETAMINOPHEN 5-500 MG PO TABS: ORAL_TABLET | ORAL | Status: DC

## 2010-11-07 MED ORDER — FENTANYL 25 MCG/HR TD PT72: MEDICATED_PATCH | TRANSDERMAL | Status: DC

## 2010-11-07 NOTE — Progress Notes (Signed)
Subjective:    Patient ID: Luke [name], male    DOB: 03-02-1971, 40 y.o.   MRN: [id number]  HPI DOing fairly well Business is doing well Marriage is good---kids fine  Ongoing knee pain Fentanyl does help him get through the day--not working as well Uses the half hydrocodone bid --or even a full one in evenings  Has been working out more Has lost 12# since last visit  Intermittent hemorrhoid problems OTC creams help  Mood has been stable He is comfortable with current Rx  Current outpatient prescriptions:aspirin 81 MG tablet, Take 81 mg by mouth daily.  , Disp: , Rfl: ;  fentaNYL (DURAGESIC - DOSED MCG/HR) 12 MCG/HR, 1 patch along with 25 micrograms patch every 2 days, Disp: 15 patch, Rfl: 0;  fentaNYL (DURAGESIC - DOSED MCG/HR) 25 MCG/HR, Apply 1 patch every 2 days for pain control, Disp: 15 patch, Rfl: 0 HYDROcodone-acetaminophen (VICODIN) 5-500 MG per tablet, Take 1 tablet three times a day as needed for severe knee pain, Disp: 90 tablet, Rfl: 0;  lidocaine (LIDODERM) 5 %, Place 1 patch onto the skin daily. Remove & Discard patch within 12 hours or as directed by MD , Disp: , Rfl: ;  PARoxetine (PAXIL) 20 MG tablet, Take 20 mg by mouth every morning.  , Disp: , Rfl:  venlafaxine (EFFEXOR-XR) 75 MG 24 hr capsule, Take 75 mg by mouth 2 (two) times daily. DAW1 , Disp: , Rfl: ;  vitamin B-12 (CYANOCOBALAMIN) 1000 MCG tablet, Take 1,000 mcg by mouth daily.  , Disp: , Rfl: ;  DISCONTD: fentaNYL (DURAGESIC - DOSED MCG/HR) 12 MCG/HR, 1 patch along with 25 micrograms patch every 2 days , Disp: , Rfl: ;  DISCONTD: fentaNYL (DURAGESIC - DOSED MCG/HR) 25 MCG/HR, Apply 1 patch every 2 days for pain control , Disp: , Rfl:  DISCONTD: HYDROcodone-acetaminophen (VICODIN) 5-500 MG per tablet, Take 1 tablet by mouth every 8 (eight) hours as needed.  , Disp: , Rfl:   Past Medical History  Diagnosis Date  . Depression   . Anxiety   . OA (osteoarthritis)   . Adenomatous colon polyp     Past Surgical  History  Procedure Date  . Appendectomy 1983  . Inguinal hernia repair 1992    right  . Wrist surgery 01/2007    right  . Knee arthroscopy ~1995 and 2005    Right  . Knee arthroscopy 10/2008    Left ([place])    Family History  Problem Relation Age of Onset  . Cancer Father     prostate  . Lymphoma Brother     Hodgkin's  . Hodgkin's lymphoma Brother   . Cancer Paternal Uncle     colon  . Heart attack Paternal Grandfather     ??    History   Social History  . Marital Status: Divorced    Spouse Name: N/A    Number of Children: 1  . 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 Smoking status: Never Smoker   . Smokeless tobacco: Not on file  . Alcohol Use: Not on file  . Drug Use: Not on file  . Sexually Active: Not on file   Other Topics Concern  . Not on file   Social History Narrative  . No narrative on file   Review of Systems  Constitutional: Negative for fever and fatigue.       [  Wears seat belt Weight is down 12# HENT: Negative for hearing loss, congestion, rhinorrhea, dental problem, postnasal drip and tinnitus.        [Overdue for dentist Eyes: Negative for visual disturbance.       [Squinting some No diplopia or vision loss Respiratory: Negative for cough, chest tightness and shortness of breath.   Cardiovascular: Negative for chest pain, palpitations and leg swelling.  Gastrointestinal: Positive for blood in stool. Negative for nausea, vomiting, abdominal pain and constipation.       [No heartburn Has had colonoscopy in past---on recall list No sig heartburn Genitourinary: Positive for difficulty urinating. Negative for urgency and decreased urine volume.       [Occ difficulty initiating urinary stream No ED Musculoskeletal: Positive for arthralgias. Negative for back pain and joint swelling.  Skin: Negative for rash.       [Had some hot flashes at times---?related to  fentanyl Neurological: Negative for dizziness, syncope, weakness, light-headedness and headaches.  Hematological: Negative for adenopathy. Does not bruise/bleed easily.  Psychiatric/Behavioral: Negative for sleep disturbance and dysphoric mood. The patient is not nervous/anxious.        Objective:   Physical Exam  Constitutional: He is oriented to person, place, and time. He appears well-developed and well-nourished. No distress.  HENT:  Head: Normocephalic and atraumatic.  Right Ear: External ear normal.  Left Ear: External ear normal.  Mouth/Throat: Oropharynx is clear and moist. No oropharyngeal exudate.       TMs normal Teeth okay  Eyes: Conjunctivae and EOM are normal. Pupils are equal, round, and reactive to light.       Fundi benign  Neck: Normal range of motion. Neck supple. No thyromegaly present.  Cardiovascular: Normal rate, regular rhythm, normal heart sounds and intact distal pulses.  Exam reveals no gallop.   No murmur heard. Pulmonary/Chest: Effort normal and breath sounds normal. No respiratory distress. He has no wheezes. He has no rales.  Abdominal: Soft. He exhibits no mass. There is no tenderness.  Musculoskeletal: Normal range of motion. He exhibits no edema and no tenderness.  Lymphadenopathy:    He has no cervical adenopathy.  Neurological: He is alert and oriented to person, place, and time. He exhibits normal muscle tone.       Normal strength  Skin: Skin is warm. No rash noted.       No lesions  Psychiatric: He has a normal mood and affect. His behavior is normal. Judgment and thought content normal.          Assessment & Plan:

## 2010-11-08 LAB — HEPATIC FUNCTION PANEL
Albumin: 4.3 g/dL (ref 3.5–5.2)
Bilirubin, Direct: 0 mg/dL (ref 0.0–0.3)
Total Protein: 7.1 g/dL (ref 6.0–8.3)

## 2010-11-08 LAB — CBC WITH DIFFERENTIAL/PLATELET
Basophils Relative: 0.7 % (ref 0.0–3.0)
Eosinophils Relative: 1.1 % (ref 0.0–5.0)
HCT: 36.6 % — ABNORMAL LOW (ref 39.0–52.0)
Lymphs Abs: 2 10*3/uL (ref 0.7–4.0)
MCV: 89.2 fl (ref 78.0–100.0)
Monocytes Absolute: 0.4 10*3/uL (ref 0.1–1.0)
Neutro Abs: 4.7 10*3/uL (ref 1.4–7.7)
RBC: 4.11 Mil/uL — ABNORMAL LOW (ref 4.22–5.81)
WBC: 7.3 10*3/uL (ref 4.5–10.5)

## 2010-11-08 LAB — LIPID PANEL
Cholesterol: 194 mg/dL (ref 0–200)
Total CHOL/HDL Ratio: 5
Triglycerides: 236 mg/dL — ABNORMAL HIGH (ref 0.0–149.0)
VLDL: 47.2 mg/dL — ABNORMAL HIGH (ref 0.0–40.0)

## 2010-11-08 LAB — BASIC METABOLIC PANEL
Chloride: 102 mEq/L (ref 96–112)
Potassium: 4.2 mEq/L (ref 3.5–5.1)

## 2010-11-08 LAB — LDL CHOLESTEROL, DIRECT: Direct LDL: 117.8 mg/dL

## 2010-11-08 LAB — TSH: TSH: 0.48 u[IU]/mL (ref 0.35–5.50)

## 2010-11-25 ENCOUNTER — Telehealth: Payer: Self-pay | Admitting: *Deleted

## 2010-11-25 MED ORDER — FENTANYL 50 MCG/HR TD PT72: 1.0000 | MEDICATED_PATCH | TRANSDERMAL | Status: DC

## 2010-11-25 NOTE — Telephone Encounter (Signed)
Patient is calling to let you know that he needs a stronger dose of the fentanyl patch. He says that he has been having to take up to 4 1/2 vicodin every day. Please advise.

## 2010-11-25 NOTE — Telephone Encounter (Signed)
Will change him to the 50 mcg patch as we had discussed

## 2010-12-03 NOTE — Assessment & Plan Note (Signed)
[facility]                         GASTROENTEROLOGY OFFICE NOTE   DELRAY, REZA                        MRN:          [id number]  DATE:03/19/2007                            DOB:          13-Aug-1970    REASON FOR REFERRAL:  Dr. Alphonsus Sias asked me to evaluate Mr. Byrom in  consultation regarding intermittent rectal bleeding, alternating  constipation and diarrhea, and abdominal discomforts.   HISTORY OF PRESENT ILLNESS:  Mr. Orton is a very pleasant 40 year old  man who has had at least one year of alternating constipation and  diarrhea, and intermittent bright red blood per rectum. He said that the  bright red blood happens when he tends to be constipated, having to  strain to move his bowels. He can go 3 to 4 days with 0 bowel movements  and this can also be followed by a day or two of multiple loose stools.  He does have abdominal discomforts that are generally relieved when he  moves his bowels. He had similar problems 8 to 10 years ago and  underwent sigmoidoscopy by Dr. Alphonsus Sias and he tells me that this was  normal. He admits to being under a lot of stress recently. His dad was  recently diagnosed with lymphoma. His brother had lymphoma two years ago  and he is undergoing a pretty combative divorce.   REVIEW OF SYSTEMS:  Notable for poor appetite, but is otherwise  essentially normal and is available on his nursing intake sheet.   PAST MEDICAL HISTORY:  1. Depression/anxiety.  2. Right knee surgery twice.  3. Right wrist surgery.  4. Status post appendectomy.  5. Status post hernia surgery.   CURRENT MEDICATIONS:  1. Paxil.  2. Lorazepam.  3. Vicodin p.r.n. which he takes less than once a day.  4. Ritalin.   ALLERGIES:  BUSPAR.   SOCIAL HISTORY:  Divorced with one son. He works Catering manager. He is a non-smoker and non-drinker. He drinks  maybe one caffeinated beverage a day.   FAMILY HISTORY:   Grandmother, aunt, and uncle on his father's side all  had colon cancer. Grandmother on his mother's side had colon polyps.  Lymphoma runs in his family.   PHYSICAL EXAMINATION:  VITAL SIGNS:  Height 6 foot 1 inches, weight 212  pounds, blood pressure 120/84.  CONSTITUTIONAL:  Generally well appearing.  NEUROLOGIC:  Awake, alert, and oriented x3.  EYES:  Extraocular movements intact.  MOUTH:  Oropharynx moist with no lesions.  NECK:  Supple. No lymphadenopathy.  CARDIOVASCULAR:  Regular rate and rhythm.  LUNGS: Clear to auscultation bilaterally.  ABDOMEN:  Soft and nontender, nondistended, normal bowel sounds.  EXTREMITIES:  No lower extremity edema.  SKIN:  No rash or lesions visible on the extremities.   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   First, he does not appear to be anemic clinically, but he will get a CBC  today to be certain. He will also get thyroid testing and a complete  metabolic profile. His alternating constipation and diarrhea is typical  for irritable bowel. I have good success with fiber supplements at  alleviating this alternating pattern, so he will begin with Citrucel and  slowly titrate upward.   Lastly, he does have a family history of colon cancer and he has had  rectal bleeding, so we should proceed with full colonoscopy at his  soonest convenience.     Rachael Fee, MD  Electronically Signed    DPJ/MedQ  DD: 03/19/2007  DT: 03/21/2007  Job #: [id number]   cc:   Karie Schwalbe, MD

## 2010-12-03 NOTE — Op Note (Signed)
Luke [name], MANETTA               ACCOUNT NO.:  [id number]   MEDICAL RECORD NO.:  [id number]          PATIENT TYPE:  AMB   LOCATION:  [facility]                         FACILITY:  [facility]   PHYSICIAN:  Marlowe Kays, M.D.  DATE OF BIRTH:  11/27/1970   DATE OF PROCEDURE:  11/07/2008  DATE OF DISCHARGE:                               OPERATIVE REPORT   PREOPERATIVE DIAGNOSIS:  Bucket handle tear medial meniscus left knee.   POSTOPERATIVE DIAGNOSES:  1. Bucket handle tear medial meniscus left knee.  2. Chondromalacia of patella.   OPERATION:  Left knee arthroscopy with partial medial meniscectomy and  shaving of patella.   SURGEON:  Dr. Simonne Come.   ASSISTANT:  Nurse.   ANESTHESIA:  General.   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  I saw him in my office on November 03, 2008.  X-rays were normal.  He had a locked knee.  He was unable to have an MRI because of metal  fragments behind his eyes, but based on his clinical appearance was felt  to have a bucket-handle tear of the medial meniscus and is here today  for the above-mentioned surgery.   DESCRIPTION OF PROCEDURE:  Satisfactory general anesthesia, Ace wrap and  knee support to right lower extremity, pneumatic tourniquet left lower  extremity with the leg Esmarched out non-sterilely and the tourniquet  inflated to 350 mmHg, thigh stabilizer applied and the left leg prepped  from stabilizer to ankle with DuraPrep and draped in a sterile in a  field.  A timeout performed.  Superomedial saline inflow.  First through  an anteromedial portal, the lateral compartment of the knee joint was  evaluated.  It was normal in appearance.  I then looked up in the medial  gutter and subpatellar area, he had grade 2/4 chondromalacia of the  median portion of the patella, which I pictured and shaved down until  smooth.  I then reversed  portals.  The ACL was intact.  He had a bucket-  handle tear of the medial meniscus which I sectioned in the  intercondylar area first and then with a second medial portal, I was  able to grasp the fragment with pituitary rongeur and then cut the  remaining attachments along the inner border of the medial meniscus with  scissors.  The specimen was removed in two fragments and given to the  patient.  I then trimmed up the residual meniscus in the intercondylar  area using baskets to remove the remainder in that location and then  along the inner rim with a combination of baskets and the 3.5 shaver,  just shaved it down until smooth.  He had minimal wear of the medial  femoral condyle which did not require shaving.  The knee joint was then  irrigated until clear and all fluid possible removed.  I closed the  three anterior portals with 4-0 nylon.  I then injected through the  inflow apparatus 20 mL of half-percent Marcaine with adrenaline  and 4 mg  of morphine.  I then removed this  portal and closed the wound with 4-0 nylon as well.  Betadine adaptic  dry sterile dressing were applied.  The tourniquet was released.  He  tolerated the procedure well and was taken to the recovery room in  satisfactory condition with no known complications.           ______________________________  Marlowe Kays, M.D.     JA/MEDQ  D:  11/07/2008  T:  11/07/2008  Job:  [id number]

## 2010-12-13 ENCOUNTER — Other Ambulatory Visit: Payer: Self-pay | Admitting: *Deleted

## 2010-12-13 MED ORDER — HYDROCODONE-ACETAMINOPHEN 5-500 MG PO TABS: ORAL_TABLET | ORAL | Status: DC

## 2010-12-13 NOTE — Telephone Encounter (Signed)
Rx called to [facility]. 

## 2010-12-23 ENCOUNTER — Other Ambulatory Visit: Payer: Self-pay | Admitting: *Deleted

## 2010-12-23 MED ORDER — FENTANYL 50 MCG/HR TD PT72: 1.0000 | MEDICATED_PATCH | TRANSDERMAL | Status: DC

## 2010-12-23 NOTE — Telephone Encounter (Signed)
Please call patient when rx is ready

## 2010-12-24 NOTE — Telephone Encounter (Signed)
Spoke with patient and advised results   

## 2011-01-09 ENCOUNTER — Telehealth: Payer: Self-pay | Admitting: *Deleted

## 2011-01-09 NOTE — Telephone Encounter (Signed)
Not sure what to say Can try a strong aluminum hydroxide deodorant on skin area to reduce sweating before putting it on but that might affect absorption Might have to change to long acting tablets but that would be twice a day and not as smooth

## 2011-01-09 NOTE — Telephone Encounter (Signed)
Patient says that since it has been hot he is having a really hard time keeping the fentanyl patches on. 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 He had to change it Tuesday and then it fell off and then had to put another one on yesterday. He says that he has tried different places on his body, he shaves the area, cleans it, doesn't have any lotions and it still will not stick. He is asking for suggestions. Please advise.

## 2011-01-09 NOTE — Telephone Encounter (Signed)
.  left message to have patient return my call.  

## 2011-01-14 NOTE — Telephone Encounter (Signed)
Spoke with patient and advised results, pt have tried everything and doesn't know what else to try, he will try the clear tape over the patches, he will ask the pharmacy.

## 2011-01-21 ENCOUNTER — Other Ambulatory Visit: Payer: Self-pay | Admitting: *Deleted

## 2011-01-21 MED ORDER — HYDROCODONE-ACETAMINOPHEN 5-500 MG PO TABS: ORAL_TABLET | ORAL | Status: DC

## 2011-01-21 MED ORDER — FENTANYL 50 MCG/HR TD PT72: 1.0000 | MEDICATED_PATCH | TRANSDERMAL | Status: DC

## 2011-01-21 NOTE — Telephone Encounter (Signed)
Spoke with patient and advised results   

## 2011-01-21 NOTE — Telephone Encounter (Signed)
Patient says that he is still having a really hard time keeping the patches on. He says that the one he has on today is already falling off from being out in the heat today which only leaves him one and he will have to put it on this evening.

## 2011-02-06 ENCOUNTER — Telehealth: Payer: Self-pay | Admitting: *Deleted

## 2011-02-06 NOTE — Telephone Encounter (Signed)
Pt called to let you know that he still cant get fentanyl patches to stick to his skin, because of the hot weather.  States they don't even stay on for a day and he is asking if he needs to change to something else, at least temporarily.  [facility]ses [facility].

## 2011-02-06 NOTE — Telephone Encounter (Signed)
Only option then would be using a twice a day sustained morphine tab--which we could use till the hot weather improves

## 2011-02-07 MED ORDER — MORPHINE SULFATE 30 MG PO TB12: 30.0000 mg | ORAL_TABLET | Freq: Two times a day (BID) | ORAL | Status: DC

## 2011-02-07 NOTE — Telephone Encounter (Signed)
Spoke with patient and advised results   

## 2011-02-07 NOTE — Telephone Encounter (Signed)
Patient does want to try the morpine tabs. Please call when script is ready.

## 2011-02-07 NOTE — Telephone Encounter (Signed)
I will try to pick the best dose based on what he is on now with the patch He may need the hydrocodone to bridge if the dose isn't enough

## 2011-02-18 ENCOUNTER — Other Ambulatory Visit: Payer: Self-pay | Admitting: *Deleted

## 2011-02-18 NOTE — Telephone Encounter (Signed)
Patient had been having trouble with keeping his fentanyl patches on with this hot weather because he works outside and sweats a lot and they want stay on for more than a day. Dr. Alphonsus Sias prescribed him morphine and told him to try it. He says that he can't take the morphine because it makes him feel groggy, tired, nauseated, can't sleep. He is asking if he can go back to the fentanyl patches and just change them more often until we are through with the hot weather. Please advise. He will needs a new script for the fentanyl patches.

## 2011-02-19 ENCOUNTER — Other Ambulatory Visit: Payer: Self-pay | Admitting: Family Medicine

## 2011-02-19 MED ORDER — FENTANYL 12 MCG/HR TD PT72: MEDICATED_PATCH | TRANSDERMAL | Status: DC

## 2011-02-19 MED ORDER — FENTANYL 25 MCG/HR TD PT72: MEDICATED_PATCH | TRANSDERMAL | Status: DC

## 2011-02-19 MED ORDER — FENTANYL 50 MCG/HR TD PT72: 1.0000 | MEDICATED_PATCH | TRANSDERMAL | Status: DC

## 2011-02-19 NOTE — Telephone Encounter (Signed)
Patient notified. He picked rx up.

## 2011-02-19 NOTE — Progress Notes (Signed)
The pt had been increased to .  Meds updated to reflect this.  Old rx's destroyed.  New rx printed.

## 2011-02-19 NOTE — Telephone Encounter (Signed)
I talked to pharmacy.  I wouldn't try to change the patches everyday.  It is okay to tape/bandage the patches on to make them stick longer.  I would try that for now.  I'll print the rx's when I get to the office.  He was prev on 37.75mcg a day.  Stop the morphine.

## 2011-02-28 ENCOUNTER — Other Ambulatory Visit: Payer: Self-pay | Admitting: *Deleted

## 2011-02-28 MED ORDER — HYDROCODONE-ACETAMINOPHEN 5-500 MG PO TABS: ORAL_TABLET | ORAL | Status: DC

## 2011-02-28 NOTE — Telephone Encounter (Signed)
Okay #90 x 0 

## 2011-02-28 NOTE — Telephone Encounter (Signed)
rx called into pharmacy

## 2011-03-18 ENCOUNTER — Other Ambulatory Visit: Payer: Self-pay | Admitting: *Deleted

## 2011-03-18 MED ORDER — FENTANYL 50 MCG/HR TD PT72: 1.0000 | MEDICATED_PATCH | TRANSDERMAL | Status: DC

## 2011-03-18 NOTE — Telephone Encounter (Signed)
Patient is requesting a refill, he will use his last patch today.  Please advise.

## 2011-03-18 NOTE — Telephone Encounter (Signed)
Spoke with patient and advised results   

## 2011-04-04 ENCOUNTER — Other Ambulatory Visit: Payer: Self-pay | Admitting: *Deleted

## 2011-04-04 MED ORDER — HYDROCODONE-ACETAMINOPHEN 5-500 MG PO TABS: ORAL_TABLET | ORAL | Status: DC

## 2011-04-04 NOTE — Telephone Encounter (Signed)
rx faxed to pharmacy manually  

## 2011-04-04 NOTE — Telephone Encounter (Signed)
Okay #90 x 0 

## 2011-04-04 NOTE — Telephone Encounter (Signed)
Form on your desk  

## 2011-04-16 ENCOUNTER — Other Ambulatory Visit: Payer: Self-pay | Admitting: Internal Medicine

## 2011-04-16 MED ORDER — FENTANYL 50 MCG/HR TD PT72: 1.0000 | MEDICATED_PATCH | TRANSDERMAL | Status: DC

## 2011-04-16 NOTE — Telephone Encounter (Signed)
Spoke with patient and advised results   

## 2011-04-16 NOTE — Telephone Encounter (Signed)
Requesting refill for FentaNyl patches.  He last had them filled on the 28th and took his last one this morning.  Please send in a refill for patches.  He can be called at [phone].

## 2011-05-09 ENCOUNTER — Other Ambulatory Visit: Payer: Self-pay | Admitting: *Deleted

## 2011-05-09 MED ORDER — HYDROCODONE-ACETAMINOPHEN 5-500 MG PO TABS: ORAL_TABLET | ORAL | Status: DC

## 2011-05-09 NOTE — Telephone Encounter (Signed)
Vicodin called to [facility].

## 2011-05-09 NOTE — Telephone Encounter (Signed)
Okay #90 x 0 

## 2011-05-12 ENCOUNTER — Ambulatory Visit (INDEPENDENT_AMBULATORY_CARE_PROVIDER_SITE_OTHER): Payer: Managed Care, Other (non HMO) | Admitting: Internal Medicine

## 2011-05-12 ENCOUNTER — Encounter: Payer: Self-pay | Admitting: Internal Medicine

## 2011-05-12 VITALS — BP 132/75 | HR 88 | Temp 97.8°F | Ht 72.0 in | Wt 215.0 lb

## 2011-05-12 DIAGNOSIS — F329 Major depressive disorder, single episode, unspecified: Secondary | ICD-10-CM

## 2011-05-12 DIAGNOSIS — Z23 Encounter for immunization: Secondary | ICD-10-CM

## 2011-05-12 DIAGNOSIS — M1990 Unspecified osteoarthritis, unspecified site: Secondary | ICD-10-CM

## 2011-05-12 MED ORDER — FENTANYL 50 MCG/HR TD PT72: 1.0000 | MEDICATED_PATCH | TRANSDERMAL | Status: DC

## 2011-05-12 NOTE — Progress Notes (Signed)
Subjective:    Patient ID: Luke [name], male    DOB: Dec 31, 1970, 40 y.o.   MRN: [id number]  HPI Changed to morphine and it made him work terrible Patches weren't staying on even a day when it was so hot Back to fentanyl now Still gets pretty good pain control Wearing off some by the end of the second day Uses about 2 vicodin a day Able to keep full work schedule  Mood has been good Expecting child now--April Son is now 7 Some irritability as the patch is wearing off---better with the new patch  No other concerns  Current Outpatient Prescriptions on File Prior to Visit  Medication Sig Dispense Refill  . aspirin 81 MG tablet Take 81 mg by mouth daily.        . fentaNYL (DURAGESIC) 50 MCG/HR Place 1 patch (50 mcg total) onto the skin every other day.  15 patch  0  . HYDROcodone-acetaminophen (VICODIN) 5-500 MG per tablet Take 1 tablet three times a day as needed for severe knee pain  90 tablet  0  . lidocaine (LIDODERM) 5 % Place 1 patch onto the skin daily. Remove & Discard patch within 12 hours or as directed by MD       . PARoxetine (PAXIL) 20 MG tablet Take 20 mg by mouth every morning.        . venlafaxine (EFFEXOR-XR) 75 MG 24 hr capsule Take 75 mg by mouth 2 (two) times daily. DAW1       . vitamin B-12 (CYANOCOBALAMIN) 1000 MCG tablet Take 1,000 mcg by mouth daily.          Allergies  Allergen Reactions  . Buspirone Hcl     REACTION: unspecified  . Fluoxetine Hcl     REACTION: unspecified  . Olanzapine     REACTION: unspecified  . Venlafaxine     REACTION: unspecified    Past Medical History  Diagnosis Date  . Depression   . Anxiety   . OA (osteoarthritis)   . Adenomatous colon polyp     Past Surgical History  Procedure Date  . Appendectomy 1983  . Inguinal hernia repair 1992    right  . Wrist surgery 01/2007    right  . Knee arthroscopy ~1995 and 2005    Right  . Knee arthroscopy 10/2008    Left ([place])    Family History  Problem Relation Age  of Onset  . Cancer Father     prostate  . Lymphoma Brother     Hodgkin's  . Hodgkin's lymphoma Brother   . Cancer Paternal Uncle     colon  . Heart attack Paternal Grandfather     ??    History   Social History  . Marital Status: Married    Spouse Name: N/A    Number of Children: 1  . 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 Smoking status: Never Smoker   . Smokeless tobacco: Never Used  . Alcohol Use: Not on file  . Drug Use: Not on file  . Sexually Active: Not on file   Other Topics Concern  . 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 No distress.  Psychiatric: He has a normal mood and affect. His behavior is normal. Judgment and thought content normal.          Assessment & Plan:

## 2011-05-12 NOTE — Assessment & Plan Note (Signed)
Mood is fine Will continue on the medications He is comfortable with continuing---didn't do well off the paroxetine (crash and needed 2nd drug)

## 2011-05-12 NOTE — Assessment & Plan Note (Signed)
Reasonable pain relief May need to add to the fentanyl if pain relief isn't close to the 48 hours vicodin prn

## 2011-06-03 ENCOUNTER — Telehealth: Payer: Self-pay | Admitting: *Deleted

## 2011-06-03 MED ORDER — FENTANYL 12 MCG/HR TD PT72: 1.0000 | MEDICATED_PATCH | TRANSDERMAL | Status: DC

## 2011-06-03 NOTE — Telephone Encounter (Signed)
Please have him add patch to the 50 to make total Rx written

## 2011-06-03 NOTE — Telephone Encounter (Signed)
Pt states he was told to call if his fentanyl patches stopped working.  He says they are not as affective- he is having to take about 4 vicodin a day due to break through pain.  The patches are only lasting less than 2 days and he would like to limit his vicodin to 2 a day.  Please advise.

## 2011-06-03 NOTE — Telephone Encounter (Signed)
Spoke with patient and advised results   

## 2011-06-09 ENCOUNTER — Other Ambulatory Visit: Payer: Self-pay | Admitting: *Deleted

## 2011-06-09 MED ORDER — HYDROCODONE-ACETAMINOPHEN 5-500 MG PO TABS: ORAL_TABLET | ORAL | Status: DC

## 2011-06-09 MED ORDER — VENLAFAXINE HCL ER 75 MG PO CP24: 75.0000 mg | ORAL_CAPSULE | Freq: Two times a day (BID) | ORAL | Status: DC

## 2011-06-09 NOTE — Telephone Encounter (Signed)
rx called into pharmacy rx sent to pharmacy by e-script Will send to [facility] for fentanyl patch

## 2011-06-09 NOTE — Telephone Encounter (Signed)
Okay hydrocodone #90 x 0 Venlafaxine for 1 year

## 2011-06-09 NOTE — Telephone Encounter (Signed)
Pt is also asking for refill on fentanyl patch, 50 mcg.  He would like to pick this up on Friday.

## 2011-06-10 MED ORDER — FENTANYL 50 MCG/HR TD PT72: 1.0000 | MEDICATED_PATCH | TRANSDERMAL | Status: DC

## 2011-06-10 NOTE — Telephone Encounter (Signed)
Spoke with patient and advised results   

## 2011-06-30 ENCOUNTER — Other Ambulatory Visit: Payer: Self-pay | Admitting: Internal Medicine

## 2011-06-30 NOTE — Telephone Encounter (Signed)
Left patient vm to return my call.

## 2011-07-01 ENCOUNTER — Other Ambulatory Visit: Payer: Self-pay | Admitting: *Deleted

## 2011-07-01 MED ORDER — FENTANYL 12 MCG/HR TD PT72: 1.0000 | MEDICATED_PATCH | TRANSDERMAL | Status: DC

## 2011-07-01 NOTE — Telephone Encounter (Signed)
fentanyl patches were recently changes to 50 and 12, since they were prescribed at a different time hs is out of the 12mg . He still has six 50's left and needs to change patches tomorrow. Needs rx for 12 mg patches.

## 2011-07-01 NOTE — Telephone Encounter (Signed)
Spoke with patient and advised results   

## 2011-07-10 ENCOUNTER — Other Ambulatory Visit: Payer: Self-pay | Admitting: *Deleted

## 2011-07-10 MED ORDER — HYDROCODONE-ACETAMINOPHEN 5-500 MG PO TABS: ORAL_TABLET | ORAL | Status: DC

## 2011-07-10 MED ORDER — FENTANYL 50 MCG/HR TD PT72: 1.0000 | MEDICATED_PATCH | TRANSDERMAL | Status: DC

## 2011-07-10 NOTE — Telephone Encounter (Signed)
Ok per verbal from Dr. Alphonsus Sias

## 2011-07-14 ENCOUNTER — Other Ambulatory Visit: Payer: Self-pay | Admitting: *Deleted

## 2011-07-14 MED ORDER — PAROXETINE HCL 20 MG PO TABS: 20.0000 mg | ORAL_TABLET | ORAL | Status: DC

## 2011-07-29 ENCOUNTER — Other Ambulatory Visit: Payer: Self-pay | Admitting: Internal Medicine

## 2011-07-29 MED ORDER — FENTANYL 12 MCG/HR TD PT72: 1.0000 | MEDICATED_PATCH | TRANSDERMAL | Status: DC

## 2011-07-29 NOTE — Telephone Encounter (Signed)
Spoke with patient and advised results   

## 2011-07-29 NOTE — Telephone Encounter (Signed)
Refill request

## 2011-08-11 ENCOUNTER — Other Ambulatory Visit: Payer: Self-pay | Admitting: *Deleted

## 2011-08-11 MED ORDER — HYDROCODONE-ACETAMINOPHEN 5-500 MG PO TABS: ORAL_TABLET | ORAL | Status: DC

## 2011-08-11 MED ORDER — FENTANYL 50 MCG/HR TD PT72: 1.0000 | MEDICATED_PATCH | TRANSDERMAL | Status: DC

## 2011-08-11 NOTE — Telephone Encounter (Signed)
Patient called regarding refill requests.  He requested Fentanyl and said there were other refills that needed to be called in as well.  He stated he used last patch this morning.  Please call patient at [phone]

## 2011-08-11 NOTE — Telephone Encounter (Signed)
Rx for fentanyl 50 and the hydrocodone written

## 2011-08-11 NOTE — Telephone Encounter (Signed)
Spoke with patient and advised results   

## 2011-08-28 ENCOUNTER — Other Ambulatory Visit: Payer: Self-pay | Admitting: *Deleted

## 2011-08-28 MED ORDER — FENTANYL 12 MCG/HR TD PT72: 1.0000 | MEDICATED_PATCH | TRANSDERMAL | Status: DC

## 2011-08-28 NOTE — Telephone Encounter (Signed)
Spoke with patient and advised results, rx ready for pick-up 

## 2011-09-10 ENCOUNTER — Ambulatory Visit (INDEPENDENT_AMBULATORY_CARE_PROVIDER_SITE_OTHER): Payer: Managed Care, Other (non HMO) | Admitting: Internal Medicine

## 2011-09-10 ENCOUNTER — Encounter: Payer: Self-pay | Admitting: Internal Medicine

## 2011-09-10 VITALS — BP 120/80 | HR 105 | Temp 97.8°F | Ht 72.0 in | Wt 216.0 lb

## 2011-09-10 DIAGNOSIS — M170 Bilateral primary osteoarthritis of knee: Secondary | ICD-10-CM

## 2011-09-10 DIAGNOSIS — M1710 Unilateral primary osteoarthritis, unspecified knee: Secondary | ICD-10-CM

## 2011-09-10 MED ORDER — HYDROCODONE-ACETAMINOPHEN 5-500 MG PO TABS: ORAL_TABLET | ORAL | Status: DC

## 2011-09-10 MED ORDER — FENTANYL 75 MCG/HR TD PT72: 1.0000 | MEDICATED_PATCH | TRANSDERMAL | Status: DC

## 2011-09-10 MED ORDER — LIDOCAINE 5 % EX PTCH: 1.0000 | MEDICATED_PATCH | CUTANEOUS | Status: DC

## 2011-09-10 NOTE — Assessment & Plan Note (Signed)
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

## 2011-09-10 NOTE — Progress Notes (Signed)
Subjective:    Patient ID: Luke [name], male    DOB: 12-10-1970, 41 y.o.   MRN: [id number]  HPI Hurt back bad a few weeks ago moving a water heater Out of work for 4-5 days  Went to Dr [employer], and using ice and TENS Did improve Did walk differently though  Knee problems are ongoing Orthopedist had nothing else to offer  Pain control has been worse for a couple of weeks Using helper to do more of the crawling,etc Uses pads when has to crawl and kneel  Fentanyl patches seem to wear off very easy lidoderm at night---don't stay on when he is working and moving about Using 5-6 hydrocodone a day for a couple of days. Had built up some extra  Has been trying to ride bike some  Current Outpatient Prescriptions on File Prior to Visit  Medication Sig Dispense Refill  . aspirin 81 MG tablet Take 81 mg by mouth daily.        . fentaNYL (DURAGESIC - DOSED MCG/HR) 12 MCG/HR Place 1 patch (12.5 mcg total) onto the skin every other day.  15 patch  0  . fentaNYL (DURAGESIC) 50 MCG/HR Place 1 patch (50 mcg total) onto the skin every other day.  15 patch  0  . HYDROcodone-acetaminophen (VICODIN) 5-500 MG per tablet Take 1 tablet three times a day as needed for severe knee pain  90 tablet  0  . lidocaine (LIDODERM) 5 % Place 1 patch onto the skin daily. Remove & Discard patch within 12 hours or as directed by MD       . PARoxetine (PAXIL) 20 MG tablet Take 1 tablet (20 mg total) by mouth every morning.  30 tablet  11  . venlafaxine (EFFEXOR-XR) 75 MG 24 hr capsule Take 1 capsule (75 mg total) by mouth 2 (two) times daily. DAW1  60 capsule  11  . vitamin B-12 (CYANOCOBALAMIN) 1000 MCG tablet Take 1,000 mcg by mouth daily.          Allergies  Allergen Reactions  . Buspirone Hcl     REACTION: unspecified  . Fluoxetine Hcl     REACTION: unspecified  . Olanzapine     REACTION: unspecified  . Venlafaxine     REACTION: unspecified    Past Medical History  Diagnosis Date  .  Depression   . Anxiety   . OA (osteoarthritis)   . Adenomatous colon polyp     Past Surgical History  Procedure Date  . Appendectomy 1983  . Inguinal hernia repair 1992    right  . Wrist surgery 01/2007    right  . Knee arthroscopy ~1995 and 2005    Right  . Knee arthroscopy 10/2008    Left ([place])    Family History  Problem Relation Age of Onset  . Cancer Father     prostate  . Lymphoma Brother     Hodgkin's  . Hodgkin's lymphoma Brother   . Cancer Paternal Uncle     colon  . Heart attack Paternal Grandfather     ??    History   Social History  . Marital Status: Married    Spouse Name: N/A    Number of Children: 1  . 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 Smoking status: Never Smoker   . Smokeless tobacco: Never Used  . Alcohol Use: Not on file  .  Drug Use: Not on file  . Sexually Active: Not on file   Other Topics Concern  . Not on file   Social History Narrative   Divorced then remarried 2010Has son and now is expecting 2nd child   Review of Systems     Objective:   Physical Exam        Assessment & Plan:

## 2011-09-12 ENCOUNTER — Ambulatory Visit: Payer: Managed Care, Other (non HMO) | Admitting: Internal Medicine

## 2011-09-29 ENCOUNTER — Telehealth: Payer: Self-pay | Admitting: *Deleted

## 2011-09-29 NOTE — Telephone Encounter (Signed)
LETVAK PATIENT  Patient calling asking for rx for Ritalin 10 mg, pt was first prescribed this in 2008 and stopped taking in 2010, per pt he's having problems concentrating, he can't focus and having fatigue. 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 Dr. Alphonsus Sias had given him this years ago to help him focus. I advised pt that Dr. Alphonsus Sias wasn't in this week and that I would ask another physician but couldn't promise this could be done, he wanted me to ask and if he has to wait then he will.

## 2011-09-29 NOTE — Telephone Encounter (Signed)
That will have to wait for Dr Alphonsus Sias since it has been so long since last px

## 2011-09-29 NOTE — Telephone Encounter (Signed)
Spoke with patient and advised he would need to wait for Dr. Karle Starch return and he's ok with this.

## 2011-10-06 NOTE — Telephone Encounter (Signed)
Left message on VM, advised pt to call for appointment if needed.

## 2011-10-06 NOTE — Telephone Encounter (Signed)
Please let him know that I am not convinced this is a good idea I would have to speak to him in person and I am not sure I would approve it even then

## 2011-10-07 ENCOUNTER — Other Ambulatory Visit: Payer: Self-pay | Admitting: *Deleted

## 2011-10-07 MED ORDER — HYDROCODONE-ACETAMINOPHEN 5-500 MG PO TABS: ORAL_TABLET | ORAL | Status: DC

## 2011-10-07 NOTE — Telephone Encounter (Signed)
rx called into pharmacy

## 2011-10-07 NOTE — Telephone Encounter (Signed)
Okay #90 x 0 

## 2011-10-08 MED ORDER — FENTANYL 75 MCG/HR TD PT72: 1.0000 | MEDICATED_PATCH | TRANSDERMAL | Status: DC

## 2011-10-08 NOTE — Telephone Encounter (Signed)
Left message on machine that rx is ready for pick-up, and it will be at our front desk.  

## 2011-11-03 ENCOUNTER — Other Ambulatory Visit: Payer: Self-pay | Admitting: *Deleted

## 2011-11-03 MED ORDER — FENTANYL 75 MCG/HR TD PT72: 1.0000 | MEDICATED_PATCH | TRANSDERMAL | Status: DC

## 2011-11-03 NOTE — Telephone Encounter (Signed)
Per verbal from Dr. Alphonsus Sias ok to refill and to let the pharmacy know.

## 2011-11-03 NOTE — Telephone Encounter (Signed)
Patient need an early refill on fentanyl patches, he has had 2 to fall off and makes him 2 short. Please advise on early refill.

## 2011-11-06 ENCOUNTER — Telehealth: Payer: Self-pay | Admitting: Internal Medicine

## 2011-11-06 NOTE — Telephone Encounter (Signed)
Patient calling for refill on Hydrocodone.

## 2011-11-06 NOTE — Telephone Encounter (Signed)
Okay #90 x 0 

## 2011-11-07 MED ORDER — HYDROCODONE-ACETAMINOPHEN 5-500 MG PO TABS: ORAL_TABLET | ORAL | Status: DC

## 2011-11-07 NOTE — Telephone Encounter (Signed)
rx called into pharmacy

## 2011-12-01 ENCOUNTER — Other Ambulatory Visit: Payer: Self-pay

## 2011-12-01 MED ORDER — FENTANYL 75 MCG/HR TD PT72: 1.0000 | MEDICATED_PATCH | TRANSDERMAL | Status: DC

## 2011-12-01 NOTE — Telephone Encounter (Signed)
Pt request written rx for Fentanyl 75 mcg/HR. Pt last seen 09/10/11. Pt said only had one patch to come off this time due to pt perspiring. When rx ready for pick up call [phone].

## 2011-12-01 NOTE — Telephone Encounter (Signed)
Spoke with patient and advised rx ready for pick-up and it will be at the front desk.  

## 2011-12-08 ENCOUNTER — Other Ambulatory Visit: Payer: Self-pay | Admitting: *Deleted

## 2011-12-08 MED ORDER — HYDROCODONE-ACETAMINOPHEN 5-500 MG PO TABS: ORAL_TABLET | ORAL | Status: DC

## 2011-12-08 NOTE — Telephone Encounter (Signed)
rx called into pharmacy

## 2011-12-08 NOTE — Telephone Encounter (Signed)
Okay #90 x 0 

## 2011-12-29 ENCOUNTER — Other Ambulatory Visit: Payer: Self-pay | Admitting: Internal Medicine

## 2011-12-29 MED ORDER — FENTANYL 75 MCG/HR TD PT72: 1.0000 | MEDICATED_PATCH | TRANSDERMAL | Status: DC

## 2011-12-29 NOTE — Telephone Encounter (Signed)
Requesting to pick up Fentanyl patch refill tomorrow to be able to take to [facility].  Please call when ready at [phone]

## 2011-12-29 NOTE — Telephone Encounter (Signed)
Spoke with patient and advised rx ready for pick-up and it will be at the front desk.  

## 2012-01-08 ENCOUNTER — Other Ambulatory Visit: Payer: Self-pay | Admitting: *Deleted

## 2012-01-08 MED ORDER — HYDROCODONE-ACETAMINOPHEN 5-500 MG PO TABS: ORAL_TABLET | ORAL | Status: DC

## 2012-01-08 NOTE — Telephone Encounter (Signed)
rx called into pharmacy

## 2012-01-08 NOTE — Telephone Encounter (Signed)
Okay #90 x 0 

## 2012-01-14 ENCOUNTER — Encounter: Payer: Managed Care, Other (non HMO) | Admitting: Internal Medicine

## 2012-01-20 ENCOUNTER — Telehealth: Payer: Self-pay | Admitting: *Deleted

## 2012-01-20 NOTE — Telephone Encounter (Signed)
Patient calling stating that he went to the beach recently and had 2 of his fentanyl patches come off in the water, also pt states with the recent increase in temp and the kind of work he does, he had another to come off. Pt spoke with pharmacist at [facility] and they suggested MASTERSOL ADHESIVE which goes around the edge of the patches to keep them on, pt will need a refill of patches before Sunday, he has 2 patches left, pt has an appt on Monday 01/26/12 for CPE. Please advise.

## 2012-01-21 MED ORDER — FENTANYL 75 MCG/HR TD PT72: 1.0000 | MEDICATED_PATCH | TRANSDERMAL | Status: DC

## 2012-01-21 NOTE — Telephone Encounter (Signed)
Spoke with patient and advised results  Spoke with patient and advised rx ready for pick-up and it will be at the front desk.  

## 2012-01-21 NOTE — Telephone Encounter (Signed)
This happened last summer too  Refill done Please have him try the strong adhesive because we can't keep filling this early

## 2012-01-26 ENCOUNTER — Ambulatory Visit (INDEPENDENT_AMBULATORY_CARE_PROVIDER_SITE_OTHER): Payer: Managed Care, Other (non HMO) | Admitting: Internal Medicine

## 2012-01-26 ENCOUNTER — Encounter: Payer: Self-pay | Admitting: Internal Medicine

## 2012-01-26 VITALS — BP 112/70 | HR 105 | Temp 98.3°F | Ht 72.0 in | Wt 210.0 lb

## 2012-01-26 DIAGNOSIS — M1710 Unilateral primary osteoarthritis, unspecified knee: Secondary | ICD-10-CM

## 2012-01-26 DIAGNOSIS — Z0000 Encounter for general adult medical examination without abnormal findings: Secondary | ICD-10-CM

## 2012-01-26 DIAGNOSIS — F411 Generalized anxiety disorder: Secondary | ICD-10-CM

## 2012-01-26 DIAGNOSIS — M170 Bilateral primary osteoarthritis of knee: Secondary | ICD-10-CM

## 2012-01-26 DIAGNOSIS — F329 Major depressive disorder, single episode, unspecified: Secondary | ICD-10-CM

## 2012-01-26 LAB — CBC WITH DIFFERENTIAL/PLATELET
Basophils Absolute: 0 10*3/uL (ref 0.0–0.1)
Basophils Relative: 0.3 % (ref 0.0–3.0)
Eosinophils Absolute: 0 10*3/uL (ref 0.0–0.7)
Lymphocytes Relative: 22.4 % (ref 12.0–46.0)
MCHC: 32.6 g/dL (ref 30.0–36.0)
MCV: 90.1 fl (ref 78.0–100.0)
Monocytes Absolute: 0.4 10*3/uL (ref 0.1–1.0)
Neutro Abs: 6.5 10*3/uL (ref 1.4–7.7)
Neutrophils Relative %: 72.4 % (ref 43.0–77.0)
RBC: 4.68 Mil/uL (ref 4.22–5.81)
RDW: 13.8 % (ref 11.5–14.6)

## 2012-01-26 LAB — BASIC METABOLIC PANEL
CO2: 31 mEq/L (ref 19–32)
Calcium: 9.9 mg/dL (ref 8.4–10.5)
Chloride: 100 mEq/L (ref 96–112)
Glucose, Bld: 99 mg/dL (ref 70–99)
Sodium: 139 mEq/L (ref 135–145)

## 2012-01-26 LAB — HEPATIC FUNCTION PANEL
ALT: 18 U/L (ref 0–53)
Bilirubin, Direct: 0 mg/dL (ref 0.0–0.3)
Total Bilirubin: 0.5 mg/dL (ref 0.3–1.2)
Total Protein: 8.1 g/dL (ref 6.0–8.3)

## 2012-01-26 LAB — VITAMIN B12: Vitamin B-12: 730 pg/mL (ref 211–911)

## 2012-01-26 NOTE — Assessment & Plan Note (Signed)
Ongoing narcotic needs but is controlled

## 2012-01-26 NOTE — Assessment & Plan Note (Signed)
Healthy other than his knees Due for repeat colon later this year

## 2012-01-26 NOTE — Progress Notes (Signed)
Subjective:    Patient ID: Luke [name], male    DOB: [birth year], 41 y.o.   MRN: [id number]  HPI Doing okay Has new adhesive to try to hold the patch on his skin---uses it on tegaderm that covers the patch In summer with the heat and sweating it is tough Uses only about 2 hydrocodone daily  Mood hasn't been great recently Seems to be down more in the past couple of months Things are fine at home and with business Appetite is off Wants to stay in bed in the morning but forces himself out  Takes B12 supplement Uses caffeine supplement bid Exercising more---weight down and in better shape  Current Outpatient Prescriptions on File Prior to Visit  Medication Sig Dispense Refill  . aspirin 81 MG tablet Take 81 mg by mouth daily.        . fentaNYL (DURAGESIC - DOSED MCG/HR) 75 MCG/HR Place 1 patch (75 mcg total) onto the skin every other day.  15 patch  0  . HYDROcodone-acetaminophen (VICODIN) 5-500 MG per tablet Take 1 tablet three times a day as needed for severe knee pain  90 tablet  0  . lidocaine (LIDODERM) 5 % Place 1 patch onto the skin daily. Remove & Discard patch within 12 hours or as directed by MD  30 patch  5  . PARoxetine (PAXIL) 20 MG tablet Take 1 tablet (20 mg total) by mouth every morning.  30 tablet  11  . venlafaxine (EFFEXOR-XR) 75 MG 24 hr capsule Take 1 capsule (75 mg total) by mouth 2 (two) times daily. DAW1  60 capsule  11  . vitamin B-12 (CYANOCOBALAMIN) 1000 MCG tablet Take 1,000 mcg by mouth daily.          Allergies  Allergen Reactions  . Buspirone Hcl     REACTION: unspecified  . Fluoxetine Hcl     REACTION: unspecified  . Olanzapine     REACTION: unspecified  . Venlafaxine     REACTION: unspecified    Past Medical History  Diagnosis Date  . Depression   . Anxiety   . OA (osteoarthritis)   . Adenomatous colon polyp     Past Surgical History  Procedure Date  . Appendectomy 1983  . Inguinal hernia repair 1992    right  . Wrist surgery  01/2007    right  . Knee arthroscopy ~1995 and 2005    Right  . Knee arthroscopy 10/2008    Left ([place])    Family History  Problem Relation Age of Onset  . Cancer Father     prostate  . Lymphoma Brother     Hodgkin's  . Hodgkin's lymphoma Brother   . Cancer Paternal Uncle     colon  . Heart attack Paternal Grandfather     ??    History   Social History  . Marital Status: Married    Spouse Name: N/A    Number of Children: 1  . 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 Smoking status: Never Smoker   . Smokeless tobacco: Never Used  . Alcohol Use: Not on file  . Drug Use: Not on file  . Sexually Active: Not on file   Other Topics Concern  . Not on file   Social History Narrative   Divorced then remarried 2010Has son and now is expecting 2nd child   Review of Systems  Constitutional: Positive for fatigue. Negative for activity change and unexpected weight change.       Wears seat belt  HENT: Negative for hearing loss, congestion, rhinorrhea, dental problem and tinnitus.        Regular with dentist  Eyes: Negative for visual disturbance.       No diplopia or unilateral vision loss Now needs reading glasses  Respiratory: Negative for cough, chest tightness and shortness of breath.   Cardiovascular: Negative for chest pain, palpitations and leg swelling.  Gastrointestinal: Negative for nausea, vomiting, abdominal pain, constipation and blood in stool.       No heartburn  Genitourinary: Negative for urgency, frequency and difficulty urinating.       No sexual problems but his desire is down with mood issues  Musculoskeletal: Positive for arthralgias. Negative for back pain and joint swelling.       Ongoing knee pain  Skin: Negative for rash.       No suspicious areas Some retained bumps at tick bite sites  Neurological: Positive for headaches. Negative for dizziness, syncope,  weakness, light-headedness and numbness.       Some headaches getting used to new glasses  Hematological: Negative for adenopathy. Does not bruise/bleed easily.  Psychiatric/Behavioral: Positive for disturbed wake/sleep cycle and dysphoric mood. The patient is nervous/anxious.        Occ sleep problems       Objective:   Physical Exam  Constitutional: He appears well-developed and well-nourished. No distress.  HENT:  Head: Normocephalic and atraumatic.  Right Ear: External ear normal.  Left Ear: External ear normal.  Mouth/Throat: Oropharynx is clear and moist. No oropharyngeal exudate.  Eyes: Conjunctivae and EOM are normal. Pupils are equal, round, and reactive to light.  Neck: Normal range of motion. Neck supple. No thyromegaly present.  Cardiovascular: Normal rate, regular rhythm, normal heart sounds and intact distal pulses.  Exam reveals no gallop.   No murmur heard. Pulmonary/Chest: Effort normal and breath sounds normal. No respiratory distress. He has no wheezes. He has no rales.  Abdominal: Soft. There is no tenderness.  Musculoskeletal: He exhibits no edema and no tenderness.  Lymphadenopathy:    He has no cervical adenopathy.  Skin: No rash noted. No erythema.  Psychiatric: He has a normal mood and affect. His behavior is normal.          Assessment & Plan:

## 2012-01-26 NOTE — Assessment & Plan Note (Signed)
Ongoing but generally controlled

## 2012-01-26 NOTE — Assessment & Plan Note (Signed)
Has worsened Will plan to increase the venlafaxine to 75/150 if symptoms don't improve in the next month or so

## 2012-01-27 ENCOUNTER — Encounter: Payer: Self-pay | Admitting: *Deleted

## 2012-02-04 ENCOUNTER — Telehealth: Payer: Self-pay | Admitting: *Deleted

## 2012-02-04 NOTE — Telephone Encounter (Signed)
Patient calling asking for increase in meds that you and him discussed at last OV, per pt things are getting worse with his father and wife, pt is stressed, shaking and feeling like he's loosing control. Per pt he thinks his wife is going thru post partum depression and his dads health is declining and he's trying to help his mother as much as possible. Please advise.

## 2012-02-05 MED ORDER — VENLAFAXINE HCL ER 75 MG PO CP24: ORAL_CAPSULE | ORAL | Status: DC

## 2012-02-05 NOTE — Telephone Encounter (Signed)
Yes Okay #90 x 0

## 2012-02-05 NOTE — Telephone Encounter (Signed)
rx sent to pharmacy by e-script Spoke with patient and advised results   

## 2012-02-05 NOTE — Telephone Encounter (Signed)
Patient also due for refill of hydrocodone on Sat ok to refill?

## 2012-02-05 NOTE — Telephone Encounter (Signed)
Please increase venlafaxine to 75mg  in AM, 150 in PM #90 x 5 Have him schedule appt in about a month to review how it is going He should call if he worsens

## 2012-02-06 ENCOUNTER — Other Ambulatory Visit: Payer: Self-pay | Admitting: *Deleted

## 2012-02-06 MED ORDER — HYDROCODONE-ACETAMINOPHEN 5-500 MG PO TABS: ORAL_TABLET | ORAL | Status: DC

## 2012-02-06 MED ORDER — VENLAFAXINE HCL ER 75 MG PO CP24: ORAL_CAPSULE | ORAL | Status: DC

## 2012-02-06 NOTE — Telephone Encounter (Signed)
rx called into pharmacy

## 2012-02-23 ENCOUNTER — Other Ambulatory Visit: Payer: Self-pay

## 2012-02-23 MED ORDER — FENTANYL 75 MCG/HR TD PT72: 1.0000 | MEDICATED_PATCH | TRANSDERMAL | Status: DC

## 2012-02-23 NOTE — Telephone Encounter (Signed)
Pt left v/m requesting rx for Fentanyl. Call when ready for pick up.

## 2012-02-23 NOTE — Telephone Encounter (Signed)
Advised patient script is ready for pick up, script placed up front.

## 2012-03-01 ENCOUNTER — Ambulatory Visit (INDEPENDENT_AMBULATORY_CARE_PROVIDER_SITE_OTHER): Payer: Managed Care, Other (non HMO) | Admitting: Internal Medicine

## 2012-03-01 ENCOUNTER — Encounter: Payer: Self-pay | Admitting: Internal Medicine

## 2012-03-01 VITALS — BP 110/80 | HR 132 | Temp 98.5°F | Wt 210.0 lb

## 2012-03-01 DIAGNOSIS — F339 Major depressive disorder, recurrent, unspecified: Secondary | ICD-10-CM

## 2012-03-01 MED ORDER — METHYLPHENIDATE HCL 5 MG PO TABS: 5.0000 mg | ORAL_TABLET | Freq: Two times a day (BID) | ORAL | Status: DC

## 2012-03-01 NOTE — Progress Notes (Signed)
Subjective:    Patient ID: Luke [name], male    DOB: June 05, 1971, 41 y.o.   MRN: [id number]  HPI Has gotten much worse Very fatigued More sadness Feels anxious Trouble sleeping Trouble focusing  Trying to exercise Using vitamins  Did increase the venlafaxine but still worsened  "I don't feel like myself at all" Ongoing leg and knee pain and tenseness---really affects him sleeping  Lots of stress but nothing special Marriage is okay Business is going good  Current Outpatient Prescriptions on File Prior to Visit  Medication Sig Dispense Refill  . aspirin 81 MG tablet Take 81 mg by mouth daily.        . fentaNYL (DURAGESIC - DOSED MCG/HR) 75 MCG/HR Place 1 patch (75 mcg total) onto the skin every other day.  15 patch  0  . HYDROcodone-acetaminophen (VICODIN) 5-500 MG per tablet Take 1 tablet three times a day as needed for severe knee pain  90 tablet  0  . lidocaine (LIDODERM) 5 % Place 1 patch onto the skin daily. Remove & Discard patch within 12 hours or as directed by MD  30 patch  5  . PARoxetine (PAXIL) 20 MG tablet Take 1 tablet (20 mg total) by mouth every morning.  30 tablet  11  . venlafaxine XR (EFFEXOR-XR) 75 MG 24 hr capsule Take 1 tablet by mouth once daily in the morning, take 2 tablets by mouth in the evening.  90 capsule  5  . vitamin B-12 (CYANOCOBALAMIN) 1000 MCG tablet Take 1,000 mcg by mouth daily.          Allergies  Allergen Reactions  . Buspirone Hcl     REACTION: unspecified  . Fluoxetine Hcl     REACTION: unspecified  . Olanzapine     REACTION: unspecified    Past Medical History  Diagnosis Date  . Depression   . Anxiety   . OA (osteoarthritis)   . Adenomatous colon polyp     Past Surgical History  Procedure Date  . Appendectomy 1983  . Inguinal hernia repair 1992    right  . Wrist surgery 01/2007    right  . Knee arthroscopy ~1995 and 2005    Right  . Knee arthroscopy 10/2008    Left ([place])    Family History  Problem  Relation Age of Onset  . Cancer Father     prostate  . Lymphoma Brother     Hodgkin's  . Hodgkin's lymphoma Brother   . Cancer Paternal Uncle     colon  . Heart attack Paternal Grandfather     ??    History   Social History  . Marital Status: Married    Spouse Name: N/A    Number of Children: 1  . 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 Smoking status: Never Smoker   . Smokeless tobacco: Never Used  . Alcohol Use: Not on file  . Drug Use: Not on file  . Sexually Active: Not on file   Other Topics Concern  . Not on file   Social History Narrative   Divorced then remarried 2010Has son and now daughter with second wife   Review of Systems Appetite is poor Weight is about the same    Objective:   Physical Exam  Psychiatric:       Markedly depressed Affect appropriate to mood Tearful No suicidal ideation---"because of my  religion and my children" anhedonic          Assessment & Plan:

## 2012-03-01 NOTE — Assessment & Plan Note (Signed)
No suicidal ideation now Has done well with methylphenidate in past---will add this back Discussed counseling---he would like to try this. Will refer to Dr Laymond Purser

## 2012-03-08 ENCOUNTER — Other Ambulatory Visit: Payer: Self-pay | Admitting: *Deleted

## 2012-03-08 MED ORDER — HYDROCODONE-ACETAMINOPHEN 5-500 MG PO TABS: ORAL_TABLET | ORAL | Status: DC

## 2012-03-08 NOTE — Telephone Encounter (Signed)
Okay #90 x 0 

## 2012-03-08 NOTE — Telephone Encounter (Signed)
Last refilled 02/06/2012

## 2012-03-08 NOTE — Telephone Encounter (Signed)
rx called into pharmacy

## 2012-03-16 ENCOUNTER — Ambulatory Visit (INDEPENDENT_AMBULATORY_CARE_PROVIDER_SITE_OTHER): Payer: Managed Care, Other (non HMO) | Admitting: Psychology

## 2012-03-16 ENCOUNTER — Ambulatory Visit (INDEPENDENT_AMBULATORY_CARE_PROVIDER_SITE_OTHER): Payer: Managed Care, Other (non HMO) | Admitting: Internal Medicine

## 2012-03-16 ENCOUNTER — Encounter: Payer: Self-pay | Admitting: Internal Medicine

## 2012-03-16 VITALS — BP 120/80 | HR 128 | Temp 98.1°F | Ht 72.0 in | Wt 211.0 lb

## 2012-03-16 DIAGNOSIS — F331 Major depressive disorder, recurrent, moderate: Secondary | ICD-10-CM

## 2012-03-16 DIAGNOSIS — F339 Major depressive disorder, recurrent, unspecified: Secondary | ICD-10-CM

## 2012-03-16 MED ORDER — FENTANYL 75 MCG/HR TD PT72: 1.0000 | MEDICATED_PATCH | TRANSDERMAL | Status: DC

## 2012-03-16 MED ORDER — PAROXETINE HCL 40 MG PO TABS: 40.0000 mg | ORAL_TABLET | Freq: Every day | ORAL | Status: DC

## 2012-03-16 NOTE — Progress Notes (Signed)
Subjective:    Patient ID: Luke [name], male    DOB: [birth year], 41 y.o.   MRN: [id number]  HPI Feels a little better "Its been a little rough" Anxiety and fatigue continue Mild help with the ritalin--especially for his fatigue  Has appt with Dr Laymond Purser today  Mostly keeping up with his work  No suicidal thoughts  Current Outpatient Prescriptions on File Prior to Visit  Medication Sig Dispense Refill  . aspirin 81 MG tablet Take 81 mg by mouth daily.        . fentaNYL (DURAGESIC - DOSED MCG/HR) 75 MCG/HR Place 1 patch (75 mcg total) onto the skin every other day.  15 patch  0  . HYDROcodone-acetaminophen (VICODIN) 5-500 MG per tablet Take 1 tablet three times a day as needed for severe knee pain  90 tablet  0  . lidocaine (LIDODERM) 5 % Place 1 patch onto the skin daily. Remove & Discard patch within 12 hours or as directed by MD  30 patch  5  . methylphenidate (RITALIN) 5 MG tablet Take 1 tablet (5 mg total) by mouth 2 (two) times daily.  60 tablet  0  . PARoxetine (PAXIL) 20 MG tablet Take 1 tablet (20 mg total) by mouth every morning.  30 tablet  11  . venlafaxine XR (EFFEXOR-XR) 75 MG 24 hr capsule Take 1 tablet by mouth once daily in the morning, take 2 tablets by mouth in the evening.  90 capsule  5  . vitamin B-12 (CYANOCOBALAMIN) 1000 MCG tablet Take 1,000 mcg by mouth daily.          Allergies  Allergen Reactions  . Buspirone Hcl     REACTION: unspecified  . Fluoxetine Hcl     REACTION: unspecified  . Olanzapine     REACTION: unspecified    Past Medical History  Diagnosis Date  . Depression   . Anxiety   . OA (osteoarthritis)   . Adenomatous colon polyp     Past Surgical History  Procedure Date  . Appendectomy 1983  . Inguinal hernia repair 1992    right  . Wrist surgery 01/2007    right  . Knee arthroscopy ~1995 and 2005    Right  . Knee arthroscopy 10/2008    Left ([place])    Family History  Problem Relation Age of Onset  . Cancer Father    prostate  . Lymphoma Brother     Hodgkin's  . Hodgkin's lymphoma Brother   . Cancer Paternal Uncle     colon  . Heart attack Paternal Grandfather     ??    History   Social History  . Marital Status: Married    Spouse Name: N/A    Number of Children: 1  . 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 Smoking status: Never Smoker   . Smokeless tobacco: Never Used  . Alcohol Use: Not on file  . Drug Use: Not on file  . Sexually Active: Not on file   Other Topics Concern  . Not on file   Social History Narrative   Divorced then remarried 2010Has son and now daughter with second wife   Review of Systems Hard to sleep with newborn Appetite is not there---eats because he knows he should or when stomach is growling--not making good food choices though Has new brand of fentanyl now---4 of them had fallen off.  These have a border that can be directly glued down    Objective:   Physical Exam  Psychiatric:       Mild depressed mood Affect appropriate          Assessment & Plan:

## 2012-03-16 NOTE — Assessment & Plan Note (Signed)
Slightly better with restarting the ritalin Will increase the paroxetine Starting back with psychologist See again in 1 month

## 2012-03-30 ENCOUNTER — Ambulatory Visit (INDEPENDENT_AMBULATORY_CARE_PROVIDER_SITE_OTHER): Payer: Managed Care, Other (non HMO) | Admitting: Psychology

## 2012-03-30 DIAGNOSIS — F331 Major depressive disorder, recurrent, moderate: Secondary | ICD-10-CM

## 2012-03-31 ENCOUNTER — Other Ambulatory Visit: Payer: Self-pay

## 2012-03-31 MED ORDER — METHYLPHENIDATE HCL 5 MG PO TABS: 5.0000 mg | ORAL_TABLET | Freq: Two times a day (BID) | ORAL | Status: DC

## 2012-03-31 NOTE — Telephone Encounter (Signed)
Spoke with patient and advised rx ready for pick-up and it will be at the front desk.  

## 2012-03-31 NOTE — Telephone Encounter (Signed)
Pt request rx Ritalin. Call when ready for pick up. 

## 2012-04-08 ENCOUNTER — Other Ambulatory Visit: Payer: Self-pay | Admitting: *Deleted

## 2012-04-08 MED ORDER — HYDROCODONE-ACETAMINOPHEN 5-500 MG PO TABS: ORAL_TABLET | ORAL | Status: DC

## 2012-04-08 NOTE — Telephone Encounter (Signed)
Last filled 03/08/2012

## 2012-04-08 NOTE — Telephone Encounter (Signed)
rx called into pharmacy

## 2012-04-08 NOTE — Telephone Encounter (Signed)
Okay #90 x 0 

## 2012-04-12 ENCOUNTER — Other Ambulatory Visit: Payer: Self-pay

## 2012-04-12 NOTE — Telephone Encounter (Signed)
Pt left v/m requesting Fentanyl patch.call when ready for pick up.

## 2012-04-13 ENCOUNTER — Ambulatory Visit (INDEPENDENT_AMBULATORY_CARE_PROVIDER_SITE_OTHER): Payer: Managed Care, Other (non HMO) | Admitting: Psychology

## 2012-04-13 DIAGNOSIS — F331 Major depressive disorder, recurrent, moderate: Secondary | ICD-10-CM

## 2012-04-13 MED ORDER — FENTANYL 75 MCG/HR TD PT72: 1.0000 | MEDICATED_PATCH | TRANSDERMAL | Status: DC

## 2012-04-13 NOTE — Telephone Encounter (Signed)
Patient will pick up rx at visit today with Dr. Laymond Purser

## 2012-04-16 ENCOUNTER — Ambulatory Visit (INDEPENDENT_AMBULATORY_CARE_PROVIDER_SITE_OTHER): Payer: Managed Care, Other (non HMO) | Admitting: Internal Medicine

## 2012-04-16 ENCOUNTER — Encounter: Payer: Self-pay | Admitting: Internal Medicine

## 2012-04-16 VITALS — BP 118/80 | HR 109 | Temp 98.3°F | Wt 215.0 lb

## 2012-04-16 DIAGNOSIS — F4310 Post-traumatic stress disorder, unspecified: Secondary | ICD-10-CM | POA: Insufficient documentation

## 2012-04-16 MED ORDER — RISPERIDONE 0.5 MG PO TABS: 0.5000 mg | ORAL_TABLET | Freq: Every day | ORAL | Status: DC
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
## 2012-04-16 NOTE — Patient Instructions (Signed)
Please try the risperidone --- 1 at bedtime and then increase to 2 in a week if still not sleeping well
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
## 2012-04-20 HISTORY — PX: LAPAROSCOPIC INGUINAL HERNIA REPAIR: SUR788

## 2012-04-22 ENCOUNTER — Ambulatory Visit (INDEPENDENT_AMBULATORY_CARE_PROVIDER_SITE_OTHER): Payer: Managed Care, Other (non HMO) | Admitting: Internal Medicine

## 2012-04-22 ENCOUNTER — Encounter: Payer: Self-pay | Admitting: Internal Medicine

## 2012-04-22 VITALS — BP 152/102 | HR 108 | Temp 97.7°F | Wt 217.2 lb

## 2012-04-22 DIAGNOSIS — K4090 Unilateral inguinal hernia, without obstruction or gangrene, not specified as recurrent: Secondary | ICD-10-CM | POA: Insufficient documentation

## 2012-04-22 NOTE — Progress Notes (Signed)
Subjective:    Patient ID: Luke [name], male    DOB: 1971-02-19, 41 y.o.   MRN: [id number]  HPI Thinks he has a hernia on his left side Now hurting Picked up water heater-- probably a couple of hundred pounds (pulling on steps on hand truck then bear hugging it to get it on stand) ~1 week ago Bulge now  Current Outpatient Prescriptions on File Prior to Visit  Medication Sig Dispense Refill  . aspirin 81 MG tablet Take 81 mg by mouth daily.        . fentaNYL (DURAGESIC - DOSED MCG/HR) 75 MCG/HR Place 1 patch (75 mcg total) onto the skin every other day.  15 patch  0  . HYDROcodone-acetaminophen (VICODIN) 5-500 MG per tablet Take 1 tablet three times a day as needed for severe knee pain  90 tablet  0  . lidocaine (LIDODERM) 5 % Place 1 patch onto the skin daily. Remove & Discard patch within 12 hours or as directed by MD  30 patch  5  . methylphenidate (RITALIN) 5 MG tablet Take 1 tablet (5 mg total) by mouth 2 (two) times daily.  60 tablet  0  . PARoxetine (PAXIL) 40 MG tablet Take 1 tablet (40 mg total) by mouth daily.  30 tablet  11  . risperiDONE (RISPERDAL) 0.5 MG tablet Take 1-2 tablets (0.5-1 mg total) by mouth at bedtime.  60 tablet  1  . venlafaxine XR (EFFEXOR-XR) 75 MG 24 hr capsule Take 1 tablet by mouth once daily in the morning, take 2 tablets by mouth in the evening.  90 capsule  5  . vitamin B-12 (CYANOCOBALAMIN) 1000 MCG tablet Take 1,000 mcg by mouth daily.          Allergies  Allergen Reactions  . Buspirone Hcl     REACTION: unspecified  . Fluoxetine Hcl     REACTION: unspecified  . Olanzapine     REACTION: unspecified    Past Medical History  Diagnosis Date  . Depression   . Anxiety   . OA (osteoarthritis)   . Adenomatous colon polyp     Past Surgical History  Procedure Date  . Appendectomy 1983  . Inguinal hernia repair 1992    right  . Wrist surgery 01/2007    right  . Knee arthroscopy ~1995 and 2005    Right  . Knee arthroscopy 10/2008    Left  ([place])    Family History  Problem Relation Age of Onset  . Cancer Father     prostate  . Lymphoma Brother     Hodgkin's  . Hodgkin's lymphoma Brother   . Cancer Paternal Uncle     colon  . Heart attack Paternal Grandfather     ??    History   Social History  . Marital Status: Married    Spouse Name: N/A    Number of Children: 1  . 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 Smoking status: Never Smoker   . Smokeless tobacco: Never Used  . Alcohol Use: No  . Drug Use: No  . Sexually Active: Not on file   Other Topics Concern  . Not on file   Social History Narrative   Divorced then remarried 2010Has son and now daughter with second wife   Review of Systems Has been constipated --worse than usual. Using MOM every few days Voiding okay---occ delay  with start of stream     Objective:   Physical Exam  Constitutional: He appears well-developed and well-nourished. No distress.  Genitourinary:       Moderate left inguinal hernia---mildly tender Bowel not into scrotum though          Assessment & Plan:

## 2012-04-22 NOTE — Assessment & Plan Note (Signed)
Fairly large I suspect he has had long standing defect and just worsened Will set up surgical evaluation

## 2012-04-23 ENCOUNTER — Encounter: Payer: Self-pay | Admitting: Gastroenterology

## 2012-05-06 ENCOUNTER — Encounter: Payer: Self-pay | Admitting: Internal Medicine

## 2012-05-06 ENCOUNTER — Ambulatory Visit (INDEPENDENT_AMBULATORY_CARE_PROVIDER_SITE_OTHER): Payer: Managed Care, Other (non HMO) | Admitting: General Surgery

## 2012-05-06 ENCOUNTER — Ambulatory Visit (INDEPENDENT_AMBULATORY_CARE_PROVIDER_SITE_OTHER): Payer: Self-pay | Admitting: General Surgery

## 2012-05-06 ENCOUNTER — Other Ambulatory Visit: Payer: Self-pay | Admitting: Internal Medicine

## 2012-05-06 ENCOUNTER — Encounter (INDEPENDENT_AMBULATORY_CARE_PROVIDER_SITE_OTHER): Payer: Self-pay | Admitting: General Surgery

## 2012-05-06 VITALS — BP 120/84 | HR 84 | Temp 97.7°F | Resp 14 | Ht 73.0 in | Wt 213.4 lb

## 2012-05-06 DIAGNOSIS — K4090 Unilateral inguinal hernia, without obstruction or gangrene, not specified as recurrent: Secondary | ICD-10-CM

## 2012-05-06 MED ORDER — FENTANYL 75 MCG/HR TD PT72: 1.0000 | MEDICATED_PATCH | TRANSDERMAL | Status: DC

## 2012-05-06 MED ORDER — HYDROCODONE-ACETAMINOPHEN 5-500 MG PO TABS: ORAL_TABLET | ORAL | Status: DC

## 2012-05-06 NOTE — Telephone Encounter (Signed)
rx called into pharmacy for hydrocodone

## 2012-05-06 NOTE — Progress Notes (Signed)
Patient ID: Luke [name], male   DOB: 1971/05/01, 41 y.o.   MRN: [id number]  No chief complaint on file.   HPI Luke [name] is a 41 y.o. male referred by Dr. Alphonsus Sias for a left inguinal hernia. Patient states approximately weeks ago he was moving a heavy drainage tube which he felt a pop at that time the nose subsequently. He states this became painful over this period of time and has noticed the bulge becoming slightly bigger. The patient is a right inguinal hernia fixed in 1992. The patient currently has no signs of constipation degeneration or incarceration. HPI  Past Medical History  Diagnosis Date  . Depression   . Anxiety   . OA (osteoarthritis)   . Adenomatous colon polyp     Past Surgical History  Procedure Date  . Appendectomy 1983  . Inguinal hernia repair 1992    right  . Wrist surgery 01/2007    right  . Knee arthroscopy ~1995 and 2005    Right  . Knee arthroscopy 10/2008    Left ([place])    Family History  Problem Relation Age of Onset  . Cancer Father     prostate  . Lymphoma Brother     Hodgkin's  . Hodgkin's lymphoma Brother   . Cancer Paternal Uncle     colon  . Heart attack Paternal Grandfather     ??    Social History History  Substance Use Topics  . Smoking status: Never Smoker   . Smokeless tobacco: Never Used  . Alcohol Use: No    Allergies  Allergen Reactions  . Buspirone Hcl     REACTION: unspecified  . Fluoxetine Hcl     REACTION: unspecified  . Olanzapine     REACTION: unspecified    Current Outpatient Prescriptions  Medication Sig Dispense Refill  . aspirin 81 MG tablet Take 81 mg by mouth daily.        . fentaNYL (DURAGESIC - DOSED MCG/HR) 75 MCG/HR Place 1 patch (75 mcg total) onto the skin every other day.  15 patch  0  . HYDROcodone-acetaminophen (VICODIN) 5-500 MG per tablet Take 1 tablet three times a day as needed for severe knee pain  90 tablet  0  . lidocaine (LIDODERM) 5 % Place 1 patch onto the skin daily. Remove  & Discard patch within 12 hours or as directed by MD  30 patch  5  . methylphenidate (RITALIN) 5 MG tablet Take 1 tablet (5 mg total) by mouth 2 (two) times daily.  60 tablet  0  . PARoxetine (PAXIL) 40 MG tablet Take 1 tablet (40 mg total) by mouth daily.  30 tablet  11  . senna-docusate (SENOKOT-S) 8.6-50 MG per tablet Take 2 tablets by mouth daily.      Marland Kitchen venlafaxine XR (EFFEXOR-XR) 75 MG 24 hr capsule Take 1 tablet by mouth once daily in the morning, take 2 tablets by mouth in the evening.  90 capsule  5  . vitamin B-12 (CYANOCOBALAMIN) 1000 MCG tablet Take 1,000 mcg by mouth daily.          Review of Systems Review of Systems  Constitutional: Negative.   HENT: Negative.   Eyes: Negative.   Respiratory: Negative.   Cardiovascular: Negative.   Gastrointestinal: Negative.   Musculoskeletal: Negative.   Neurological: Negative.     Blood pressure 120/84, pulse 84, temperature 97.7 F (36.5 C), resp. rate 14, height 6\' 1"  (1.854 m), weight 213 lb 6.4 oz (96.798 kg).  Physical Exam Physical Exam  Constitutional: He is oriented to person, place, and time. He appears well-developed and well-nourished.  HENT:  Head: Normocephalic and atraumatic.  Eyes: Conjunctivae normal are normal. Pupils are equal, round, and reactive to light.  Neck: Normal range of motion. Neck supple.  Cardiovascular: Normal rate and regular rhythm.   Pulmonary/Chest: Effort normal and breath sounds normal.  Abdominal: Soft. A hernia is present. Hernia confirmed positive in the left inguinal area.  Musculoskeletal: Normal range of motion.  Neurological: He is alert and oriented to person, place, and time.      Assessment    41 year old male with a reducible left inguinal hernia.    Plan    1. We'll proceed with laparoscopic left Inguinal hernia repair with mesh. 2. All risks and benefits were discussed with the patient, to generally include infection, bleeding, damage to surrounding structures, and  recurrence. Alternatives were offered and described.  All questions were answered and the patient voiced understanding of the procedure and wishes to proceed at this point.        Marigene Ehlers., Kenshin Splawn 05/06/2012, 12:43 PM

## 2012-05-13 ENCOUNTER — Other Ambulatory Visit: Payer: Self-pay

## 2012-05-13 ENCOUNTER — Ambulatory Visit: Payer: Managed Care, Other (non HMO) | Admitting: Psychology

## 2012-05-13 NOTE — Telephone Encounter (Signed)
Request rx Ritalin. Call when ready for pick up. Pt having surgery 05/14/12.

## 2012-05-14 DIAGNOSIS — K4090 Unilateral inguinal hernia, without obstruction or gangrene, not specified as recurrent: Secondary | ICD-10-CM

## 2012-05-14 MED ORDER — METHYLPHENIDATE HCL 5 MG PO TABS: 5.0000 mg | ORAL_TABLET | Freq: Two times a day (BID) | ORAL | Status: DC

## 2012-05-14 NOTE — Telephone Encounter (Signed)
Left message on machine that rx is ready for pick-up, and it will be at our front desk.  

## 2012-05-17 ENCOUNTER — Telehealth (INDEPENDENT_AMBULATORY_CARE_PROVIDER_SITE_OTHER): Payer: Self-pay

## 2012-05-17 NOTE — Telephone Encounter (Signed)
Patient called in after having inguinal hernia surgery on Friday complaining of bruising of his scrotum area and asked of this was normal. I told him it was normal to have bruising and swelling and advised to use ice packs on the area. Patient understood and will call with any other questions or concerns.

## 2012-05-18 ENCOUNTER — Encounter: Payer: Self-pay | Admitting: Internal Medicine

## 2012-05-21 ENCOUNTER — Telehealth: Payer: Self-pay | Admitting: Internal Medicine

## 2012-05-21 ENCOUNTER — Encounter: Payer: Self-pay | Admitting: Internal Medicine

## 2012-05-21 MED ORDER — HYDROCODONE-ACETAMINOPHEN 5-500 MG PO TABS: ORAL_TABLET | ORAL | Status: DC

## 2012-05-21 NOTE — Telephone Encounter (Signed)
Please call him We can phone in an emergency script for the hydrocodone with sig of 2 tabs up to 4 times per day #60 x 0 I will need to see him next week or get a note from the surgeon about the increased need for pain meds if he needs more before ~11/15 when his next script is due

## 2012-05-21 NOTE — Telephone Encounter (Signed)
Rx called in as prescribed and advise pt of Dr. Karle Starch message

## 2012-05-28 ENCOUNTER — Ambulatory Visit (INDEPENDENT_AMBULATORY_CARE_PROVIDER_SITE_OTHER): Payer: Managed Care, Other (non HMO) | Admitting: Internal Medicine

## 2012-05-28 ENCOUNTER — Encounter (INDEPENDENT_AMBULATORY_CARE_PROVIDER_SITE_OTHER): Payer: Self-pay | Admitting: General Surgery

## 2012-05-28 ENCOUNTER — Encounter: Payer: Self-pay | Admitting: Internal Medicine

## 2012-05-28 ENCOUNTER — Ambulatory Visit (INDEPENDENT_AMBULATORY_CARE_PROVIDER_SITE_OTHER): Payer: Managed Care, Other (non HMO) | Admitting: General Surgery

## 2012-05-28 VITALS — BP 112/80 | HR 104 | Temp 98.3°F | Wt 217.0 lb

## 2012-05-28 VITALS — BP 114/78 | HR 76 | Temp 98.1°F | Resp 14 | Ht 73.0 in | Wt 219.4 lb

## 2012-05-28 DIAGNOSIS — F4310 Post-traumatic stress disorder, unspecified: Secondary | ICD-10-CM

## 2012-05-28 DIAGNOSIS — Z9889 Other specified postprocedural states: Secondary | ICD-10-CM

## 2012-05-28 DIAGNOSIS — Z23 Encounter for immunization: Secondary | ICD-10-CM

## 2012-05-28 NOTE — Assessment & Plan Note (Signed)
Didn't tolerate the risperdal Nightmares and flashbacks are better without the meds Will continue the counseling Has noted extra stress with marriage---?related to increased symptoms

## 2012-05-28 NOTE — Progress Notes (Signed)
Patient ID: Luke [name], male   DOB: [birth year], 41 y.o.   MRN: [id number] The patient is a 41 year old male status post left inguinal hernia. 2 weeks ago. The patient has been doing well except for a bit of discomfort and left inguinal area. Patient's wounds have healed and her current clean dry and intact. He does state he had some testicular hematoma which has since resolved. On my exam there's no hernia palpable.  Patient followup x1 month. Patient is to continue with no heavy lifting for another 2-3 weeks.

## 2012-05-28 NOTE — Progress Notes (Signed)
Subjective:    Patient ID: Luke [name], male    DOB: [birth year], 41 y.o.   MRN: [id number]  HPI Finally improving from the left inguinal surgery Needed extra hydrocodone for this  Tried the risperidone Made him groggy and tired during the day Tried half but still made him tired  Nightmares are some better now Only flash backs were at night This is better without the med  Still seeing Dr Luke [name]  Current Outpatient Prescriptions on File Prior to Visit  Medication Sig Dispense Refill  . aspirin 81 MG tablet Take 81 mg by mouth daily.        . fentaNYL (DURAGESIC - DOSED MCG/HR) 75 MCG/HR Place 1 patch (75 mcg total) onto the skin every other day.  15 patch  0  . HYDROcodone-acetaminophen (VICODIN) 5-500 MG per tablet Take 1 tablet three times a day as needed for severe knee pain  60 tablet  0  . lidocaine (LIDODERM) 5 % Place 1 patch onto the skin daily. Remove & Discard patch within 12 hours or as directed by MD  30 patch  5  . methylphenidate (RITALIN) 5 MG tablet Take 1 tablet (5 mg total) by mouth 2 (two) times daily.  60 tablet  0  . PARoxetine (PAXIL) 40 MG tablet Take 1 tablet (40 mg total) by mouth daily.  30 tablet  11  . senna-docusate (SENOKOT-S) 8.6-50 MG per tablet Take 2 tablets by mouth daily.      Marland Kitchen venlafaxine XR (EFFEXOR-XR) 75 MG 24 hr capsule Take 1 tablet by mouth once daily in the morning, take 2 tablets by mouth in the evening.  90 capsule  5  . vitamin B-12 (CYANOCOBALAMIN) 1000 MCG tablet Take 1,000 mcg by mouth daily.          Allergies  Allergen Reactions  . Buspirone Hcl     REACTION: unspecified  . Fluoxetine Hcl     REACTION: unspecified  . Olanzapine     REACTION: unspecified    Past Medical History  Diagnosis Date  . Depression   . Anxiety   . OA (osteoarthritis)   . Adenomatous colon polyp     Past Surgical History  Procedure Date  . Appendectomy 1983  . Inguinal hernia repair 1992    right  . Wrist surgery 01/2007    right  .  Knee arthroscopy ~1995 and 2005    Right  . Knee arthroscopy 10/2008    Left ([place])  . Laparoscopic inguinal hernia repair 10/13    left    Family History  Problem Relation Age of Onset  . Cancer Father     prostate  . Lymphoma Brother     Hodgkin's  . Hodgkin's lymphoma Brother   . Cancer Paternal Uncle     colon  . Heart attack Paternal Grandfather     ??    History   Social History  . Marital Status: Married    Spouse Name: N/A    Number of Children: 1  . 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 Smoking status: Never Smoker   . Smokeless tobacco: Never Used  . Alcohol Use: No  . Drug Use: No  . Sexually Active: Not on file   Other Topics Concern  . Not on file   Social History Narrative   Divorced then remarried 2010Has son and now daughter with second  wife   Review of Systems Appetite is okay Back to work but has kept it light    Objective:   Physical Exam  Constitutional: He appears well-developed and well-nourished. No distress.  Psychiatric: He has a normal mood and affect. His behavior is normal. Thought content normal.          Assessment & Plan:

## 2012-05-28 NOTE — Addendum Note (Signed)
Addended by: Sueanne Margarita on: 05/28/2012 08:35 AM   Modules accepted: Orders

## 2012-05-31 ENCOUNTER — Encounter: Payer: Self-pay | Admitting: Internal Medicine

## 2012-05-31 ENCOUNTER — Other Ambulatory Visit: Payer: Self-pay | Admitting: *Deleted

## 2012-05-31 NOTE — Telephone Encounter (Signed)
Faxed request states take 2 tablets by mouth up to 4 times daily as needed for pain. Please advise on instructions, last refilled 05/21/2012

## 2012-06-01 MED ORDER — HYDROCODONE-ACETAMINOPHEN 5-500 MG PO TABS: 1.0000 | ORAL_TABLET | Freq: Three times a day (TID) | ORAL | Status: DC | PRN

## 2012-06-01 NOTE — Telephone Encounter (Signed)
That was just after the hernia surgery  It is back to 1 tid prn #90 x 0

## 2012-06-01 NOTE — Telephone Encounter (Signed)
rx called into pharmacy Spoke with patient and advised results   

## 2012-06-02 ENCOUNTER — Other Ambulatory Visit: Payer: Self-pay | Admitting: Internal Medicine

## 2012-06-02 MED ORDER — FENTANYL 75 MCG/HR TD PT72: 1.0000 | MEDICATED_PATCH | TRANSDERMAL | Status: DC

## 2012-06-02 NOTE — Telephone Encounter (Signed)
Spoke with patient and advised rx ready for pick-up and it will be at the front desk.  

## 2012-06-22 ENCOUNTER — Encounter (INDEPENDENT_AMBULATORY_CARE_PROVIDER_SITE_OTHER): Payer: Self-pay | Admitting: General Surgery

## 2012-06-22 ENCOUNTER — Other Ambulatory Visit: Payer: Self-pay

## 2012-06-22 ENCOUNTER — Ambulatory Visit (INDEPENDENT_AMBULATORY_CARE_PROVIDER_SITE_OTHER): Payer: Managed Care, Other (non HMO) | Admitting: General Surgery

## 2012-06-22 VITALS — BP 130/80 | HR 72 | Temp 97.6°F | Resp 16 | Ht 73.0 in | Wt 218.4 lb

## 2012-06-22 DIAGNOSIS — Z09 Encounter for follow-up examination after completed treatment for conditions other than malignant neoplasm: Secondary | ICD-10-CM

## 2012-06-22 MED ORDER — METHYLPHENIDATE HCL 5 MG PO TABS: 5.0000 mg | ORAL_TABLET | Freq: Two times a day (BID) | ORAL | Status: DC

## 2012-06-22 NOTE — Progress Notes (Signed)
Patient ID: Luke [name], male   DOB: [birth year], 41 y.o.   MRN: [id number] Patient is a 35 -year-old male status post left femoral hernia prepared status post 6 weeks.  Patient's previous anal pain is resolved. He is to continue with his work duties as needed.  On exam: No hernia palpation  Assessment and plan: Patient resumed a normal lifting Patient followup when necessary

## 2012-06-22 NOTE — Telephone Encounter (Signed)
Margaret called for status of Ritalin rx. Notified at front desk for pick up per [facility].

## 2012-06-22 NOTE — Telephone Encounter (Signed)
Alveda Reasons left v/m requesting rx for Ritalin. Call [name] when rx ready for pick up.

## 2012-06-23 ENCOUNTER — Encounter: Payer: Self-pay | Admitting: Internal Medicine

## 2012-06-23 ENCOUNTER — Encounter: Payer: Self-pay | Admitting: Family Medicine

## 2012-06-23 ENCOUNTER — Ambulatory Visit (INDEPENDENT_AMBULATORY_CARE_PROVIDER_SITE_OTHER): Payer: Managed Care, Other (non HMO) | Admitting: Family Medicine

## 2012-06-23 VITALS — BP 180/100 | HR 126

## 2012-06-23 DIAGNOSIS — F410 Panic disorder [episodic paroxysmal anxiety] without agoraphobia: Secondary | ICD-10-CM

## 2012-06-23 MED ORDER — ALPRAZOLAM 0.5 MG PO TABS: 0.5000 mg | ORAL_TABLET | Freq: Three times a day (TID) | ORAL | Status: DC | PRN

## 2012-06-23 NOTE — Progress Notes (Signed)
Subjective:    Patient ID: Luke [name], male    DOB: 1970-12-05, 41 y.o.   MRN: [id number]  HPI Here with anxiety symptoms - having back to back panic attacks- family brought him here and we were called to the parking lot to get him out of the care  having some trouble at home -he talks in great detail about problems with his wife, current marriage, prev marriage and PTSD He is currently very focused on his wife's faults today and repeats himself often He states he has a very troubled past but does not elaborate He has had panic attacks before but never this bad    Emotional and tearful Heart is racing  Very nervous, and shaky No cp or sob  He does feel out of controlled Absolutely no thoughts of suicide or harm to others- on questioning several times    He sees Dr Laymond Purser - had to cancel last appt due to hernia surgery- but has another appt scheduled and she is helpful  He is on effexor xr and paxil currently  Usually pretty well controlled   Patient Active Problem List  Diagnosis  . ADENOMATOUS COLONIC POLYP  . ANXIETY  . DEPRESSION  . Routine general medical examination at a health care facility  . Osteoarthritis of both knees  . Major depressive disorder, recurrent  . Post traumatic stress disorder (PTSD)  . Left inguinal hernia   Past Medical History  Diagnosis Date  . Depression   . Anxiety   . OA (osteoarthritis)   . Adenomatous colon polyp   . Inguinal hernia    Past Surgical History  Procedure Date  . Appendectomy 1983  . Inguinal hernia repair 1992    right  . Wrist surgery 01/2007    right  . Knee arthroscopy ~1995 and 2005    Right  . Knee arthroscopy 10/2008    Left ([place])  . Laparoscopic inguinal hernia repair 10/13    left   History  Substance Use Topics  . Smoking status: Never Smoker   . Smokeless tobacco: Never Used  . Alcohol Use: No   Family History  Problem Relation Age of Onset  . Cancer Father     prostate  . Lymphoma  Brother     Hodgkin's  . Hodgkin's lymphoma Brother   . Cancer Paternal Uncle     colon  . Heart attack Paternal Grandfather     ??   Allergies  Allergen Reactions  . Buspirone Hcl     REACTION: unspecified  . Fluoxetine Hcl     REACTION: unspecified  . Olanzapine     REACTION: unspecified   Current Outpatient Prescriptions on File Prior to Visit  Medication Sig Dispense Refill  . aspirin 81 MG tablet Take 81 mg by mouth daily.        . fentaNYL (DURAGESIC - DOSED MCG/HR) 75 MCG/HR Place 1 patch (75 mcg total) onto the skin every other day.  15 patch  0  . HYDROcodone-acetaminophen (VICODIN) 5-500 MG per tablet Take 1 tablet by mouth 3 (three) times daily as needed for pain.  90 tablet  0  . lidocaine (LIDODERM) 5 % Place 1 patch onto the skin daily. Remove & Discard patch within 12 hours or as directed by MD  30 patch  5  . methylphenidate (RITALIN) 5 MG tablet Take 1 tablet (5 mg total) by mouth 2 (two) times daily.  60 tablet  0  . PARoxetine (PAXIL) 40 MG tablet Take  1 tablet (40 mg total) by mouth daily.  30 tablet  11  . senna-docusate (SENOKOT-S) 8.6-50 MG per tablet Take 2 tablets by mouth daily.      Marland Kitchen venlafaxine XR (EFFEXOR-XR) 75 MG 24 hr capsule Take 1 tablet by mouth once daily in the morning, take 2 tablets by mouth in the evening.  90 capsule  5  . vitamin B-12 (CYANOCOBALAMIN) 1000 MCG tablet Take 1,000 mcg by mouth daily.                Review of Systems Review of Systems  Constitutional: Negative for fever, appetite change, fatigue and unexpected weight change.  Eyes: Negative for pain and visual disturbance.  Respiratory: Negative for cough and shortness of breath.   Cardiovascular: Negative for cp or palpitations    Gastrointestinal: Negative for nausea, diarrhea and constipation.  Genitourinary: Negative for urgency and frequency.  Skin: Negative for pallor or rash   Neurological: Negative for weakness, light-headedness, numbness and headaches.   Hematological: Negative for adenopathy. Does not bruise/bleed easily.  Psychiatric/Behavioral: pos for dysphoric mood and anxiety and panic attacks-neg for SI or HI .         Objective:   Physical Exam  Constitutional: He is oriented to person, place, and time. He appears well-developed and well-nourished. He appears distressed.       Emotionally distressed,tearful and shaking , wringing hands and repeating himself frequently  HENT:  Head: Normocephalic and atraumatic.  Mouth/Throat: Oropharynx is clear and moist.  Eyes: Conjunctivae normal and EOM are normal. Pupils are equal, round, and reactive to light. Right eye exhibits no discharge. Left eye exhibits no discharge.  Neck: Normal range of motion. Neck supple. No thyromegaly present.  Cardiovascular: Regular rhythm and normal heart sounds.  Exam reveals no gallop.   No murmur heard.      After sitting and talking  -  HR went down to 93 and bp went down as well  Pulmonary/Chest: Effort normal and breath sounds normal. No respiratory distress. He has no wheezes. He has no rales.  Musculoskeletal: He exhibits edema.  Lymphadenopathy:    He has no cervical adenopathy.  Neurological: He is alert and oriented to person, place, and time. He has normal reflexes. He displays tremor.  Skin: Skin is warm and dry. No rash noted. No erythema. No pallor.  Psychiatric: His mood appears anxious. His affect is angry. His speech is delayed and tangential. He is agitated. He is not aggressive, not actively hallucinating and not combative. Thought content is paranoid. Thought content is not delusional. Cognition and memory are not impaired. He exhibits a depressed mood. He expresses no homicidal and no suicidal ideation. He expresses no suicidal plans and no homicidal plans.       Pt is severely anxious  Repeats himself frequently , very inattentive and hesitant to discuss anything but his wife's character flaws (very difficult to focus him today) Is  rocking himself and wringing his hands Seems guarded and paranoid today- but not openly delusional Was able to calm him quite a bit by the end of the visit, however    He is inattentive.          Assessment & Plan:

## 2012-06-23 NOTE — Patient Instructions (Addendum)
Take xanax up to three times daily for severe anxiety and panic attacks Take your other medicines as directed Continue to see Dr Laymond Purser for counseling If you worsen or feel you could hurt yourself or others-please call and go to [place] [facility] Make appt with Dr Alphonsus Sias at check out

## 2012-06-24 NOTE — Assessment & Plan Note (Signed)
Acutely in pt with hx of anxiety and PTSD- who takes paxil and effexor (with no missed doses) In much distress today , but able to calm him  Given xanax .5 to take tonight - to help with anxiety until he can see his PCP He has taken this before and tolerates it well- warnings voiced  His anger and paranoia are worrisome, though he voices repeatedly that he would not harm himself or others I do have some suspicion of a more complex psych dx such as schizoaffective disorder based on his behavior today Gave him careful instructions to call and go to [facility] ER tonight to see behavioral health if symptoms worsen or do not improve  He has appt with Dr Alphonsus Sias tomorrow and Dr Laymond Purser upcoming

## 2012-06-25 ENCOUNTER — Encounter: Payer: Self-pay | Admitting: Internal Medicine

## 2012-06-25 ENCOUNTER — Ambulatory Visit (INDEPENDENT_AMBULATORY_CARE_PROVIDER_SITE_OTHER): Payer: Managed Care, Other (non HMO) | Admitting: Internal Medicine

## 2012-06-25 VITALS — BP 110/80 | HR 104 | Temp 98.5°F | Wt 217.0 lb

## 2012-06-25 DIAGNOSIS — F411 Generalized anxiety disorder: Secondary | ICD-10-CM

## 2012-06-25 MED ORDER — ALPRAZOLAM 0.5 MG PO TABS: 0.5000 mg | ORAL_TABLET | Freq: Three times a day (TID) | ORAL | Status: DC | PRN

## 2012-06-25 NOTE — Progress Notes (Signed)
Subjective:    Patient ID: Luke [name], male    DOB: [birth year], 41 y.o.   MRN: [id number]  HPI Had terrible spell Wife and he are having some troubles "she has changed.. Won't communicate" Starting counseling with pastor at church (both of them) Will speak to girlfriends and not him  Feels stressed out with this "She pushed me to my limit" the other day Disrespectful text messages Got panic attack---knows he has them occ fast breathing, heart rate, etc Usually can get this under control but couldn't this time  Did use xanax twice since that spell  Current Outpatient Prescriptions on File Prior to Visit  Medication Sig Dispense Refill  . ALPRAZolam (XANAX) 0.5 MG tablet Take 1 tablet (0.5 mg total) by mouth 3 (three) times daily as needed for anxiety (panic attack ).  15 tablet  0  . aspirin 81 MG tablet Take 81 mg by mouth daily.        . fentaNYL (DURAGESIC - DOSED MCG/HR) 75 MCG/HR Place 1 patch (75 mcg total) onto the skin every other day.  15 patch  0  . HYDROcodone-acetaminophen (VICODIN) 5-500 MG per tablet Take 1 tablet by mouth 3 (three) times daily as needed for pain.  90 tablet  0  . lidocaine (LIDODERM) 5 % Place 1 patch onto the skin daily. Remove & Discard patch within 12 hours or as directed by MD  30 patch  5  . methylphenidate (RITALIN) 5 MG tablet Take 1 tablet (5 mg total) by mouth 2 (two) times daily.  60 tablet  0  . PARoxetine (PAXIL) 40 MG tablet Take 1 tablet (40 mg total) by mouth daily.  30 tablet  11  . senna-docusate (SENOKOT-S) 8.6-50 MG per tablet Take 2 tablets by mouth daily.      Marland Kitchen venlafaxine XR (EFFEXOR-XR) 75 MG 24 hr capsule Take 1 tablet by mouth once daily in the morning, take 2 tablets by mouth in the evening.  90 capsule  5  . vitamin B-12 (CYANOCOBALAMIN) 1000 MCG tablet Take 1,000 mcg by mouth daily.          Allergies  Allergen Reactions  . Buspirone Hcl     REACTION: unspecified  . Fluoxetine Hcl     REACTION: unspecified  .  Olanzapine     REACTION: unspecified    Past Medical History  Diagnosis Date  . Depression   . Anxiety   . OA (osteoarthritis)   . Adenomatous colon polyp   . Inguinal hernia     Past Surgical History  Procedure Date  . Appendectomy 1983  . Inguinal hernia repair 1992    right  . Wrist surgery 01/2007    right  . Knee arthroscopy ~1995 and 2005    Right  . Knee arthroscopy 10/2008    Left ([place])  . Laparoscopic inguinal hernia repair 10/13    left    Family History  Problem Relation Age of Onset  . Cancer Father     prostate  . Lymphoma Brother     Hodgkin's  . Hodgkin's lymphoma Brother   . Cancer Paternal Uncle     colon  . Heart attack Paternal Grandfather     ??    History   Social History  . Marital Status: Married    Spouse Name: N/A    Number of Children: 1  . 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 Smoking status: Never Smoker   . Smokeless tobacco: Never Used  . Alcohol Use: No  . Drug Use: No  . Sexually Active: Not on file   Other Topics Concern  . Not on file   Social History Narrative   Divorced then remarried 2010Has son and now daughter with second wife   Review of Systems Some sleep problems Appetite is poor No thoughts of death or suicidal ideation     Objective:   Physical Exam  Psychiatric:       Sedate and some depressed mood Not anxious No psychomotor agitation Normal speech Insight and judgement appear unimpaired          Assessment & Plan:

## 2012-06-25 NOTE — Assessment & Plan Note (Addendum)
Has long had mild panic symptoms but this was the first time he couldn't control things Now has xanax for PRN Hopes that marital counseling will deal with his current stress (he has chronic pain and then difficult hernia repair as well as some marital issues)  If not improving with the pastoral counseling, may need to consider psychiatry referral

## 2012-06-29 ENCOUNTER — Other Ambulatory Visit: Payer: Self-pay | Admitting: Internal Medicine

## 2012-06-29 MED ORDER — FENTANYL 75 MCG/HR TD PT72: 1.0000 | MEDICATED_PATCH | TRANSDERMAL | Status: DC

## 2012-06-29 MED ORDER — HYDROCODONE-ACETAMINOPHEN 5-500 MG PO TABS: 1.0000 | ORAL_TABLET | Freq: Three times a day (TID) | ORAL | Status: DC | PRN

## 2012-06-29 NOTE — Telephone Encounter (Signed)
Patient notified as instructed by telephone v/m that fentanyl  rx is ready for pick up at front desk and vicodin called to [facility] as instructed.

## 2012-06-30 ENCOUNTER — Ambulatory Visit (INDEPENDENT_AMBULATORY_CARE_PROVIDER_SITE_OTHER): Payer: Managed Care, Other (non HMO) | Admitting: Psychology

## 2012-06-30 DIAGNOSIS — F331 Major depressive disorder, recurrent, moderate: Secondary | ICD-10-CM

## 2012-07-22 ENCOUNTER — Ambulatory Visit (INDEPENDENT_AMBULATORY_CARE_PROVIDER_SITE_OTHER): Payer: Managed Care, Other (non HMO) | Admitting: Psychology

## 2012-07-22 DIAGNOSIS — F331 Major depressive disorder, recurrent, moderate: Secondary | ICD-10-CM

## 2012-07-23 ENCOUNTER — Other Ambulatory Visit: Payer: Self-pay

## 2012-07-23 MED ORDER — ALPRAZOLAM 0.5 MG PO TABS: 0.5000 mg | ORAL_TABLET | Freq: Three times a day (TID) | ORAL | Status: DC | PRN

## 2012-07-23 NOTE — Telephone Encounter (Signed)
Px written for call in   

## 2012-07-23 NOTE — Telephone Encounter (Signed)
Alveda Reasons request refill alprazolam to [place].Please advise.

## 2012-07-23 NOTE — Telephone Encounter (Signed)
Rx called in as prescribed 

## 2012-07-28 ENCOUNTER — Ambulatory Visit (INDEPENDENT_AMBULATORY_CARE_PROVIDER_SITE_OTHER): Payer: Managed Care, Other (non HMO) | Admitting: Psychology

## 2012-07-28 ENCOUNTER — Ambulatory Visit (INDEPENDENT_AMBULATORY_CARE_PROVIDER_SITE_OTHER): Payer: Managed Care, Other (non HMO) | Admitting: Internal Medicine

## 2012-07-28 ENCOUNTER — Encounter: Payer: Self-pay | Admitting: Internal Medicine

## 2012-07-28 VITALS — BP 132/80 | HR 95 | Temp 98.6°F | Ht 73.0 in | Wt 217.0 lb

## 2012-07-28 DIAGNOSIS — F411 Generalized anxiety disorder: Secondary | ICD-10-CM

## 2012-07-28 DIAGNOSIS — F331 Major depressive disorder, recurrent, moderate: Secondary | ICD-10-CM

## 2012-07-28 MED ORDER — FENTANYL 75 MCG/HR TD PT72: 1.0000 | MEDICATED_PATCH | TRANSDERMAL | Status: DC

## 2012-07-28 MED ORDER — HYDROCODONE-ACETAMINOPHEN 5-500 MG PO TABS: 1.0000 | ORAL_TABLET | Freq: Three times a day (TID) | ORAL | Status: DC | PRN

## 2012-07-28 NOTE — Assessment & Plan Note (Signed)
Improved Still seeing Dr Laymond Purser and this is helping Marital issues are better Will continue prn xanax

## 2012-07-28 NOTE — Progress Notes (Signed)
Subjective:    Patient ID: Luke [name], male    DOB: April 12, 1971, 42 y.o.   MRN: [id number]  HPI Doing better Wife went with him to Dr Laymond Purser and may understand better about his PTSD Marital issues seemed to have settled down  Still gets some triggers and will get bad feeling or panic type spells When he feels a little tremor, etc and knows things are building up, he will use 1/2 xanax and he can settle himself down Reading some Ephriam Knuckles books about improving marriage, etc Using ~1.5 a day---using regularly at night and that helps more restful sleep  Current Outpatient Prescriptions on File Prior to Visit  Medication Sig Dispense Refill  . ALPRAZolam (XANAX) 0.5 MG tablet Take 1 tablet (0.5 mg total) by mouth 3 (three) times daily as needed for anxiety (panic attack ).  30 tablet  0  . aspirin 81 MG tablet Take 81 mg by mouth daily.        . fentaNYL (DURAGESIC - DOSED MCG/HR) 75 MCG/HR Place 1 patch (75 mcg total) onto the skin every other day.  15 patch  0  . HYDROcodone-acetaminophen (VICODIN) 5-500 MG per tablet Take 1 tablet by mouth 3 (three) times daily as needed for pain.  90 tablet  0  . lidocaine (LIDODERM) 5 % Place 1 patch onto the skin daily. Remove & Discard patch within 12 hours or as directed by MD  30 patch  5  . methylphenidate (RITALIN) 5 MG tablet Take 1 tablet (5 mg total) by mouth 2 (two) times daily.  60 tablet  0  . PARoxetine (PAXIL) 40 MG tablet Take 1 tablet (40 mg total) by mouth daily.  30 tablet  11  . senna-docusate (SENOKOT-S) 8.6-50 MG per tablet Take 2 tablets by mouth daily.      Marland Kitchen venlafaxine XR (EFFEXOR-XR) 75 MG 24 hr capsule Take 1 tablet by mouth once daily in the morning, take 2 tablets by mouth in the evening.  90 capsule  5  . vitamin B-12 (CYANOCOBALAMIN) 1000 MCG tablet Take 1,000 mcg by mouth daily.          Allergies  Allergen Reactions  . Buspirone Hcl     REACTION: unspecified  . Fluoxetine Hcl     REACTION: unspecified  .  Olanzapine     REACTION: unspecified    Past Medical History  Diagnosis Date  . Depression   . Anxiety   . OA (osteoarthritis)   . Adenomatous colon polyp   . Inguinal hernia     Past Surgical History  Procedure Date  . Appendectomy 1983  . Inguinal hernia repair 1992    right  . Wrist surgery 01/2007    right  . Knee arthroscopy ~1995 and 2005    Right  . Knee arthroscopy 10/2008    Left ([place])  . Laparoscopic inguinal hernia repair 10/13    left    Family History  Problem Relation Age of Onset  . Cancer Father     prostate  . Lymphoma Brother     Hodgkin's  . Hodgkin's lymphoma Brother   . Cancer Paternal Uncle     colon  . Heart attack Paternal Grandfather     ??    History   Social History  . Marital Status: Married    Spouse Name: N/A    Number of Children: 1  . 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 Smoking status: Never Smoker   . Smokeless tobacco: Never Used  . Alcohol Use: No  . Drug Use: No  . Sexually Active: Not on file   Other Topics Concern  . Not on file   Social History Narrative   Divorced then remarried 2010Has son and now daughter with second wife   Review of Systems Appetite is okay---trying to eat healthier Now going to [employer]     Objective:   Physical Exam  Constitutional: He appears well-developed and well-nourished. No distress.  Psychiatric: He has a normal mood and affect. His behavior is normal.          Assessment & Plan:

## 2012-08-04 ENCOUNTER — Encounter: Payer: Self-pay | Admitting: Internal Medicine

## 2012-08-09 ENCOUNTER — Other Ambulatory Visit: Payer: Self-pay | Admitting: Family Medicine

## 2012-08-09 MED ORDER — ALPRAZOLAM 0.5 MG PO TABS: 0.5000 mg | ORAL_TABLET | Freq: Three times a day (TID) | ORAL | Status: DC | PRN

## 2012-08-09 NOTE — Telephone Encounter (Signed)
rx called into pharmacy

## 2012-08-09 NOTE — Telephone Encounter (Signed)
Okay #60 x 0 

## 2012-08-17 ENCOUNTER — Encounter: Payer: Self-pay | Admitting: Internal Medicine

## 2012-08-18 ENCOUNTER — Ambulatory Visit: Payer: Managed Care, Other (non HMO) | Admitting: Psychology

## 2012-08-20 ENCOUNTER — Other Ambulatory Visit: Payer: Self-pay | Admitting: Internal Medicine

## 2012-08-20 MED ORDER — FENTANYL 75 MCG/HR TD PT72: 1.0000 | MEDICATED_PATCH | TRANSDERMAL | Status: DC

## 2012-08-20 NOTE — Telephone Encounter (Signed)
Sent message thru my chart that rx ready for pick-up

## 2012-08-27 ENCOUNTER — Other Ambulatory Visit: Payer: Self-pay | Admitting: Internal Medicine

## 2012-08-30 ENCOUNTER — Other Ambulatory Visit (INDEPENDENT_AMBULATORY_CARE_PROVIDER_SITE_OTHER): Payer: Self-pay

## 2012-08-30 MED ORDER — HYDROCODONE-ACETAMINOPHEN 5-500 MG PO TABS: 1.0000 | ORAL_TABLET | Freq: Three times a day (TID) | ORAL | Status: DC | PRN

## 2012-08-30 NOTE — Telephone Encounter (Signed)
rx called into pharmacy

## 2012-08-30 NOTE — Telephone Encounter (Signed)
Dr.Letvak patient, ok to fill? 

## 2012-08-30 NOTE — Telephone Encounter (Signed)
Ok to refill #90, 0 refills  In dr. Karle Starch absence

## 2012-09-07 ENCOUNTER — Encounter: Payer: Self-pay | Admitting: Internal Medicine

## 2012-09-09 ENCOUNTER — Encounter: Payer: Self-pay | Admitting: Internal Medicine

## 2012-09-10 ENCOUNTER — Telehealth: Payer: Self-pay

## 2012-09-10 ENCOUNTER — Telehealth: Payer: Self-pay | Admitting: Internal Medicine

## 2012-09-10 MED ORDER — FENTANYL 75 MCG/HR TD PT72: 1.0000 | MEDICATED_PATCH | TRANSDERMAL | Status: DC

## 2012-09-10 NOTE — Telephone Encounter (Signed)
Discussed with Sherry Okay for early refill 

## 2012-09-10 NOTE — Telephone Encounter (Signed)
Dr.Letvak I have printed the rx, please see message from pt.

## 2012-09-10 NOTE — Telephone Encounter (Signed)
Please see prior auth form on your desk to be filled out, I can not do prior auth over the phone only the physician can.

## 2012-09-10 NOTE — Telephone Encounter (Signed)
Caller: Sherry/Other; Phone: [phone]; Reason for Call: Cordelia Pen calling from [facility] to advise pt had Fentanyl patch refiiled 08/21/12 at another pharmacy ( unknown) andthe Rx today would be 10 days early, therefore Insurance Co will not pay for Rx and pt would need to pay out of pocket.  Cordelia Pen needs to know if Dr Alphonsus Sias will approve Rx 10 days early.  PLEASE F/U WITH PHARMACY, THANK YOU.

## 2012-09-10 NOTE — Telephone Encounter (Signed)
Sherry with [facility] left v/m that pt brought in Fentanyl rx  Dated 09/10/12, but cannot fill because pt got a months supply on 08/21/12 at another pharmacy. Cordelia Pen needs call back for approval and authorization to fill early.

## 2012-09-10 NOTE — Telephone Encounter (Signed)
Discussed with Luke [name] for early refill

## 2012-09-14 ENCOUNTER — Other Ambulatory Visit: Payer: Self-pay | Admitting: Internal Medicine

## 2012-09-14 ENCOUNTER — Ambulatory Visit (INDEPENDENT_AMBULATORY_CARE_PROVIDER_SITE_OTHER): Payer: Managed Care, Other (non HMO) | Admitting: Psychology

## 2012-09-14 DIAGNOSIS — F331 Major depressive disorder, recurrent, moderate: Secondary | ICD-10-CM

## 2012-09-14 MED ORDER — ALPRAZOLAM 0.5 MG PO TABS: 0.5000 mg | ORAL_TABLET | Freq: Three times a day (TID) | ORAL | Status: DC | PRN

## 2012-09-14 NOTE — Telephone Encounter (Signed)
Okay #60 x 0 

## 2012-09-14 NOTE — Telephone Encounter (Signed)
rx called into pharmacy

## 2012-09-15 ENCOUNTER — Other Ambulatory Visit: Payer: Self-pay | Admitting: *Deleted

## 2012-09-15 MED ORDER — VENLAFAXINE HCL ER 75 MG PO CP24: ORAL_CAPSULE | ORAL | Status: DC

## 2012-09-16 ENCOUNTER — Telehealth: Payer: Self-pay | Admitting: Internal Medicine

## 2012-09-16 ENCOUNTER — Encounter: Payer: Self-pay | Admitting: Internal Medicine

## 2012-09-16 MED ORDER — FENTANYL 75 MCG/HR TD PT72: 1.0000 | MEDICATED_PATCH | TRANSDERMAL | Status: DC

## 2012-09-16 NOTE — Telephone Encounter (Signed)
I have let him know this is written on MyChart

## 2012-09-20 ENCOUNTER — Ambulatory Visit: Payer: Managed Care, Other (non HMO) | Admitting: Internal Medicine

## 2012-09-21 ENCOUNTER — Ambulatory Visit (INDEPENDENT_AMBULATORY_CARE_PROVIDER_SITE_OTHER): Payer: Managed Care, Other (non HMO) | Admitting: Internal Medicine

## 2012-09-21 ENCOUNTER — Encounter: Payer: Self-pay | Admitting: Internal Medicine

## 2012-09-21 VITALS — BP 140/90 | HR 93 | Temp 98.0°F | Wt 207.0 lb

## 2012-09-21 DIAGNOSIS — M170 Bilateral primary osteoarthritis of knee: Secondary | ICD-10-CM

## 2012-09-21 NOTE — Assessment & Plan Note (Signed)
Doing okay with just the fentanyl Irritability is better since he stopped the hydrocodone Has brand of the patches which is sticking better Will continue the fentanyl---- alternative would be to try MS contin at 60-90 bid to tid (better if we don't have to switch)

## 2012-09-21 NOTE — Progress Notes (Signed)
Subjective:    Patient ID: Luke [name], male    DOB: Nov 12, 1970, 42 y.o.   MRN: [id number]  HPI Has had ongoing problems keeping the fentanyl patches on Stopped the hydrocodone--seemed to make him "ill" and increase anxiety Thinks it may have been the combination Pain is no worse really off the hydrocodone----more frequent and may ache longer but not worth the side effects--feeling more on edge  Didn't take MS contin for long Doesn't remember feeling well then but not sure what was happening  Current Outpatient Prescriptions on File Prior to Visit  Medication Sig Dispense Refill  . ALPRAZolam (XANAX) 0.5 MG tablet Take 1 tablet (0.5 mg total) by mouth 3 (three) times daily as needed for anxiety (panic attack ).  60 tablet  0  . aspirin 81 MG tablet Take 81 mg by mouth daily.        . fentaNYL (DURAGESIC - DOSED MCG/HR) 75 MCG/HR Place 1 patch (75 mcg total) onto the skin every other day.  15 patch  0  . lidocaine (LIDODERM) 5 % Place 1 patch onto the skin daily. Remove & Discard patch within 12 hours or as directed by MD  30 patch  5  . methylphenidate (RITALIN) 5 MG tablet Take 1 tablet (5 mg total) by mouth 2 (two) times daily.  60 tablet  0  . PARoxetine (PAXIL) 40 MG tablet Take 1 tablet (40 mg total) by mouth daily.  30 tablet  11  . senna-docusate (SENOKOT-S) 8.6-50 MG per tablet Take 2 tablets by mouth daily.      Marland Kitchen venlafaxine XR (EFFEXOR-XR) 75 MG 24 hr capsule Take 1 tablet by mouth once daily in the morning, take 2 tablets by mouth in the evening.  90 capsule  3  . vitamin B-12 (CYANOCOBALAMIN) 1000 MCG tablet Take 1,000 mcg by mouth daily.         No current facility-administered medications on file prior to visit.    Allergies  Allergen Reactions  . Buspirone Hcl     REACTION: unspecified  . Fluoxetine Hcl     REACTION: unspecified  . Olanzapine     REACTION: unspecified    Past Medical History  Diagnosis Date  . Depression   . Anxiety   . OA (osteoarthritis)    . Adenomatous colon polyp   . Inguinal hernia     Past Surgical History  Procedure Laterality Date  . Appendectomy  1983  . Inguinal hernia repair  1992    right  . Wrist surgery  01/2007    right  . Knee arthroscopy  ~1995 and 2005    Right  . Knee arthroscopy  10/2008    Left ([place])  . Laparoscopic inguinal hernia repair  10/13    left    Family History  Problem Relation Age of Onset  . Cancer Father     prostate  . Lymphoma Brother     Hodgkin's  . Hodgkin's lymphoma Brother   . Cancer Paternal Uncle     colon  . Heart attack Paternal Grandfather     ??    History   Social History  . Marital Status: Married    Spouse Name: N/A    Number of Children: 1  . 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 Smoking status: Never Smoker   . Smokeless tobacco: Never Used  . Alcohol Use:  No  . Drug Use: No  . Sexually Active: Not on file   Other Topics Concern  . Not on file   Social History Narrative   Divorced then remarried 2010   Has son and now daughter with second wife   Review of Systems Appetite is better---lost a lot of weight with decreased appetite which is now better off the hydrocodone Not missing work Sleeps okay    Objective:   Physical Exam  Constitutional: He appears well-developed and well-nourished. No distress.  Psychiatric: He has a normal mood and affect. His behavior is normal.          Assessment & Plan:

## 2012-09-27 ENCOUNTER — Encounter: Payer: Self-pay | Admitting: Internal Medicine

## 2012-09-27 NOTE — Telephone Encounter (Signed)
Dr.Tower can you help please? I'm not at the office today, I'm at [facility].

## 2012-09-29 ENCOUNTER — Ambulatory Visit (INDEPENDENT_AMBULATORY_CARE_PROVIDER_SITE_OTHER): Payer: Managed Care, Other (non HMO) | Admitting: Psychology

## 2012-09-29 DIAGNOSIS — F331 Major depressive disorder, recurrent, moderate: Secondary | ICD-10-CM

## 2012-09-30 ENCOUNTER — Encounter: Payer: Self-pay | Admitting: Internal Medicine

## 2012-10-04 ENCOUNTER — Other Ambulatory Visit: Payer: Self-pay | Admitting: *Deleted

## 2012-10-04 MED ORDER — FENTANYL 75 MCG/HR TD PT72: 1.0000 | MEDICATED_PATCH | TRANSDERMAL | Status: DC

## 2012-10-04 NOTE — Telephone Encounter (Signed)
Per message in my chart, ok to print refill  Sent message to pt advised rx ready to pick-up today

## 2012-10-11 ENCOUNTER — Encounter: Payer: Self-pay | Admitting: Gastroenterology

## 2012-10-11 ENCOUNTER — Encounter: Payer: Self-pay | Admitting: Internal Medicine

## 2012-10-13 ENCOUNTER — Ambulatory Visit (INDEPENDENT_AMBULATORY_CARE_PROVIDER_SITE_OTHER): Payer: Managed Care, Other (non HMO) | Admitting: Psychology

## 2012-10-13 DIAGNOSIS — F331 Major depressive disorder, recurrent, moderate: Secondary | ICD-10-CM

## 2012-10-15 ENCOUNTER — Encounter: Payer: Self-pay | Admitting: Internal Medicine

## 2012-10-20 ENCOUNTER — Ambulatory Visit (INDEPENDENT_AMBULATORY_CARE_PROVIDER_SITE_OTHER): Payer: Managed Care, Other (non HMO) | Admitting: Psychology

## 2012-10-20 ENCOUNTER — Ambulatory Visit (INDEPENDENT_AMBULATORY_CARE_PROVIDER_SITE_OTHER): Payer: Managed Care, Other (non HMO) | Admitting: Internal Medicine

## 2012-10-20 ENCOUNTER — Other Ambulatory Visit: Payer: Self-pay | Admitting: Internal Medicine

## 2012-10-20 ENCOUNTER — Encounter: Payer: Self-pay | Admitting: Internal Medicine

## 2012-10-20 VITALS — BP 118/76 | HR 103 | Temp 97.3°F | Wt 205.0 lb

## 2012-10-20 DIAGNOSIS — F331 Major depressive disorder, recurrent, moderate: Secondary | ICD-10-CM

## 2012-10-20 DIAGNOSIS — F339 Major depressive disorder, recurrent, unspecified: Secondary | ICD-10-CM

## 2012-10-20 MED ORDER — DULOXETINE HCL 30 MG PO CPEP: 30.0000 mg | ORAL_CAPSULE | Freq: Every day | ORAL | Status: DC

## 2012-10-20 MED ORDER — FENTANYL 75 MCG/HR TD PT72: 1.0000 | MEDICATED_PATCH | TRANSDERMAL | Status: DC

## 2012-10-20 NOTE — Progress Notes (Signed)
Subjective:    Patient ID: Luke [name], male    DOB: Dec 11, 1970, 42 y.o.   MRN: [id number]  HPI Still having trouble with the patches Fall off easy--though usually will last at least one day  Depression is worse "Just overwhelming now" Meds don't seem to be helping Affecting everything---"I get so tired I don't want to deal with anything" Not thinking about dying or suicide  Still has marital issues She had trouble understanding his PTSD No overt flashbacks but memories come back and worsen his depression Still feels his work with Dr Laymond Purser is helping  Current Outpatient Prescriptions on File Prior to Visit  Medication Sig Dispense Refill  . ALPRAZolam (XANAX) 0.5 MG tablet Take 1 tablet (0.5 mg total) by mouth 3 (three) times daily as needed for anxiety (panic attack ).  60 tablet  0  . aspirin 81 MG tablet Take 81 mg by mouth daily.        . fentaNYL (DURAGESIC - DOSED MCG/HR) 75 MCG/HR Place 1 patch (75 mcg total) onto the skin every other day.  15 patch  0  . lidocaine (LIDODERM) 5 % Place 1 patch onto the skin daily. Remove & Discard patch within 12 hours or as directed by MD  30 patch  5  . methylphenidate (RITALIN) 5 MG tablet Take 1 tablet (5 mg total) by mouth 2 (two) times daily.  60 tablet  0  . PARoxetine (PAXIL) 40 MG tablet Take 1 tablet (40 mg total) by mouth daily.  30 tablet  11  . venlafaxine XR (EFFEXOR-XR) 75 MG 24 hr capsule Take 1 tablet by mouth once daily in the morning, take 2 tablets by mouth in the evening.  90 capsule  3  . vitamin B-12 (CYANOCOBALAMIN) 1000 MCG tablet Take 1,000 mcg by mouth daily.         No current facility-administered medications on file prior to visit.    Allergies  Allergen Reactions  . Buspirone Hcl     REACTION: unspecified  . Fluoxetine Hcl     REACTION: unspecified  . Olanzapine     REACTION: unspecified  . Hydrocodone     Makes him irritable    Past Medical History  Diagnosis Date  . Depression   . Anxiety   .  OA (osteoarthritis)   . Adenomatous colon polyp   . Inguinal hernia     Past Surgical History  Procedure Laterality Date  . Appendectomy  1983  . Inguinal hernia repair  1992    right  . Wrist surgery  01/2007    right  . Knee arthroscopy  ~1995 and 2005    Right  . Knee arthroscopy  10/2008    Left ([place])  . Laparoscopic inguinal hernia repair  10/13    left    Family History  Problem Relation Age of Onset  . Cancer Father     prostate  . Lymphoma Brother     Hodgkin's  . Hodgkin's lymphoma Brother   . Cancer Paternal Uncle     colon  . Heart attack Paternal Grandfather     ??    History   Social History  . Marital Status: Married    Spouse Name: N/A    Number of Children: 1  . 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 Smoking status: Never Smoker   . Smokeless tobacco: Never Used  .  Alcohol Use: No  . Drug Use: No  . Sexually Active: Not on file   Other Topics Concern  . Not on file   Social History Narrative   Divorced then remarried 2010   Has son and now daughter with second wife   Review of Systems Not sleeping well---up and down all niht Plenty of work in business---may delay due to his mood though Appetite is off with the pain and depression--has lost weight    Objective:   Physical Exam  Psychiatric: His behavior is normal. Thought content normal.  Depressed Appropriate affect          Assessment & Plan:

## 2012-10-20 NOTE — Patient Instructions (Signed)
Please start the cymbalta 30mg  daily now. Decrease the venlafaxine to 75mg  twice a day x 3 days, then 75mg  once a day for 3 days, then stop it. At that point, increase the cymbalta to 60mg  a day

## 2012-10-20 NOTE — Assessment & Plan Note (Signed)
Ongoing problems PTSD to some degree Ongoing pain which also has an effect  Discussed options Will try cymbalta instead of the venlafaxine Could consider ablilify for augmentation but would probably want to send to psychiatrist

## 2012-10-21 MED ORDER — ALPRAZOLAM 0.5 MG PO TABS: 0.5000 mg | ORAL_TABLET | Freq: Three times a day (TID) | ORAL | Status: DC | PRN

## 2012-10-21 MED ORDER — METHYLPHENIDATE HCL 5 MG PO TABS: 5.0000 mg | ORAL_TABLET | Freq: Two times a day (BID) | ORAL | Status: DC

## 2012-10-21 NOTE — Telephone Encounter (Signed)
rx called into pharmacy, Xanax

## 2012-10-26 ENCOUNTER — Ambulatory Visit: Payer: Managed Care, Other (non HMO) | Admitting: Internal Medicine

## 2012-10-27 ENCOUNTER — Ambulatory Visit (INDEPENDENT_AMBULATORY_CARE_PROVIDER_SITE_OTHER): Payer: Managed Care, Other (non HMO) | Admitting: Psychology

## 2012-10-27 DIAGNOSIS — F331 Major depressive disorder, recurrent, moderate: Secondary | ICD-10-CM

## 2012-11-03 ENCOUNTER — Ambulatory Visit (INDEPENDENT_AMBULATORY_CARE_PROVIDER_SITE_OTHER): Payer: Managed Care, Other (non HMO) | Admitting: Psychology

## 2012-11-03 DIAGNOSIS — F331 Major depressive disorder, recurrent, moderate: Secondary | ICD-10-CM

## 2012-11-10 ENCOUNTER — Ambulatory Visit (AMBULATORY_SURGERY_CENTER): Payer: Managed Care, Other (non HMO) | Admitting: *Deleted

## 2012-11-10 ENCOUNTER — Encounter: Payer: Self-pay | Admitting: Gastroenterology

## 2012-11-10 ENCOUNTER — Encounter: Payer: Self-pay | Admitting: Internal Medicine

## 2012-11-10 VITALS — Ht 73.0 in | Wt 201.2 lb

## 2012-11-10 DIAGNOSIS — Z800 Family history of malignant neoplasm of digestive organs: Secondary | ICD-10-CM

## 2012-11-10 DIAGNOSIS — Z1211 Encounter for screening for malignant neoplasm of colon: Secondary | ICD-10-CM

## 2012-11-10 MED ORDER — MOVIPREP 100 G PO SOLR: 1.0000 | Freq: Once | ORAL | Status: DC

## 2012-11-10 NOTE — Progress Notes (Signed)
Denies allergies to eggs or soy products. Denies any complications with sedation or anesthesia. 

## 2012-11-12 MED ORDER — FENTANYL 75 MCG/HR TD PT72: 1.0000 | MEDICATED_PATCH | TRANSDERMAL | Status: DC

## 2012-11-12 NOTE — Telephone Encounter (Signed)
rx signed by Dr. Alphonsus Sias and I gave to patient in person.

## 2012-11-17 ENCOUNTER — Ambulatory Visit (INDEPENDENT_AMBULATORY_CARE_PROVIDER_SITE_OTHER): Payer: Managed Care, Other (non HMO) | Admitting: Psychology

## 2012-11-17 DIAGNOSIS — F331 Major depressive disorder, recurrent, moderate: Secondary | ICD-10-CM

## 2012-11-18 ENCOUNTER — Other Ambulatory Visit: Payer: Self-pay | Admitting: Internal Medicine

## 2012-11-18 MED ORDER — METHYLPHENIDATE HCL 5 MG PO TABS: 5.0000 mg | ORAL_TABLET | Freq: Two times a day (BID) | ORAL | Status: DC

## 2012-11-18 NOTE — Telephone Encounter (Signed)
Patient advised Rx ready for pick up at front desk.

## 2012-11-23 ENCOUNTER — Ambulatory Visit (INDEPENDENT_AMBULATORY_CARE_PROVIDER_SITE_OTHER): Payer: Managed Care, Other (non HMO) | Admitting: Internal Medicine

## 2012-11-23 ENCOUNTER — Encounter: Payer: Self-pay | Admitting: Internal Medicine

## 2012-11-23 VITALS — BP 130/70 | HR 98 | Temp 97.9°F | Wt 204.0 lb

## 2012-11-23 DIAGNOSIS — F329 Major depressive disorder, single episode, unspecified: Secondary | ICD-10-CM

## 2012-11-23 DIAGNOSIS — M1710 Unilateral primary osteoarthritis, unspecified knee: Secondary | ICD-10-CM

## 2012-11-23 DIAGNOSIS — F411 Generalized anxiety disorder: Secondary | ICD-10-CM

## 2012-11-23 DIAGNOSIS — M170 Bilateral primary osteoarthritis of knee: Secondary | ICD-10-CM

## 2012-11-23 NOTE — Assessment & Plan Note (Signed)
Still having trouble with the patches Awakens in sweats at night from his PTSD dreams and sweats in day also Even having trouble keeping them on 36 hours! Might have to change to oral (like MS contin)

## 2012-11-23 NOTE — Assessment & Plan Note (Signed)
Better with the cymbalta now

## 2012-11-23 NOTE — Progress Notes (Signed)
Subjective:    Patient ID: Luke [name], male    DOB: [birth year], 42 y.o.   MRN: [id number]  HPI Doing better on the cymbalta Did go up to the 60mg  Clearly less anxiety Depression is better---but still having a hard time  Did separate from wife----hoping to go through counseling and save the marriage Will be starting [place] counseling (she couldn't tolerate the commute, issues with PTSD---she moved to apt in [place]) He hopes to sell current home and then decide a better location for the two of them  Hasn't noticed any difference in his pain on the med  Current Outpatient Prescriptions on File Prior to Visit  Medication Sig Dispense Refill  . ALPRAZolam (XANAX) 0.5 MG tablet Take 1 tablet (0.5 mg total) by mouth 3 (three) times daily as needed for anxiety (panic attack ).  60 tablet  0  . aspirin 81 MG tablet Take 81 mg by mouth daily.        . DULoxetine (CYMBALTA) 30 MG capsule Take 1-2 capsules (30-60 mg total) by mouth daily.  60 capsule  1  . fentaNYL (DURAGESIC - DOSED MCG/HR) 75 MCG/HR Place 1 patch (75 mcg total) onto the skin every 36 (thirty-six) hours.  20 patch  0  . lidocaine (LIDODERM) 5 % Place 1 patch onto the skin daily. Remove & Discard patch within 12 hours or as directed by MD  30 patch  5  . methylphenidate (RITALIN) 5 MG tablet Take 1 tablet (5 mg total) by mouth 2 (two) times daily.  60 tablet  0  . MOVIPREP 100 G SOLR Take 1 kit (100 g total) by mouth once.  1 kit  0  . MULTIPLE VITAMINS PO Take 2 tablets by mouth daily.      Marland Kitchen PARoxetine (PAXIL) 40 MG tablet Take 1 tablet (40 mg total) by mouth daily.  30 tablet  11  . vitamin B-12 (CYANOCOBALAMIN) 1000 MCG tablet Take 1,000 mcg by mouth daily.         No current facility-administered medications on file prior to visit.    Allergies  Allergen Reactions  . Buspirone Hcl     REACTION: unspecified  . Fluoxetine Hcl     REACTION: unspecified  . Olanzapine     REACTION: unspecified  . Hydrocodone    Makes him irritable    Past Medical History  Diagnosis Date  . Depression   . Anxiety   . OA (osteoarthritis)   . Adenomatous colon polyp   . Inguinal hernia     Past Surgical History  Procedure Laterality Date  . Appendectomy  1983  . Inguinal hernia repair  1992    right  . Wrist surgery  01/2007    right  . Knee arthroscopy  ~1995 and 2005    Right  . Knee arthroscopy  10/2008    Left ([place])  . Laparoscopic inguinal hernia repair  10/13    left    Family History  Problem Relation Age of Onset  . Cancer Father     prostate  . Lymphoma Brother     Hodgkin's  . Hodgkin's lymphoma Brother   . Cancer Paternal Uncle     colon  . Colon polyps Paternal Uncle   . Heart attack Paternal Grandfather     ??  . Esophageal cancer Neg Hx   . Rectal cancer Neg Hx   . Stomach cancer Neg Hx     History   Social History  . Marital Status:  Married    Spouse Name: N/A    Number of Children: 1  . 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 Smoking status: Never Smoker   . Smokeless tobacco: Never Used  . Alcohol Use: No  . Drug Use: No  . Sexually Active: Not on file   Other Topics Concern  . Not on file   Social History Narrative   Divorced then remarried 2010   Has son and now daughter with second wife   Review of Systems Still not sleeping well Appetite is still not good Has lost 20# over the past few months    Objective:   Physical Exam  Psychiatric:  Calm More insight into problems with his marriage Normal appearance and speech          Assessment & Plan:

## 2012-11-23 NOTE — Assessment & Plan Note (Signed)
Improved Has dealt positively with marital separation Seems better on the cymbalta Would increase to 90mg  daily if he worsens again

## 2012-11-24 ENCOUNTER — Encounter: Payer: Self-pay | Admitting: Gastroenterology

## 2012-11-24 ENCOUNTER — Ambulatory Visit (AMBULATORY_SURGERY_CENTER): Payer: Managed Care, Other (non HMO) | Admitting: Gastroenterology

## 2012-11-24 ENCOUNTER — Encounter: Payer: Self-pay | Admitting: Internal Medicine

## 2012-11-24 VITALS — BP 121/87 | HR 86 | Temp 96.8°F | Resp 11 | Ht 73.0 in | Wt 201.0 lb

## 2012-11-24 DIAGNOSIS — Z86010 Personal history of colonic polyps: Secondary | ICD-10-CM

## 2012-11-24 DIAGNOSIS — Z1211 Encounter for screening for malignant neoplasm of colon: Secondary | ICD-10-CM

## 2012-11-24 DIAGNOSIS — Z800 Family history of malignant neoplasm of digestive organs: Secondary | ICD-10-CM

## 2012-11-24 MED ORDER — SODIUM CHLORIDE 0.9 % IV SOLN: 500.0000 mL | INTRAVENOUS | Status: DC

## 2012-11-24 NOTE — Patient Instructions (Addendum)
YOU HAD AN ENDOSCOPIC PROCEDURE TODAY AT THE [facility]: Refer to the procedure report that was given to you for any specific questions about what was found during the examination.  If the procedure report does not answer your questions, please call your gastroenterologist to clarify.  If you requested that your care partner not be given the details of your procedure findings, then the procedure report has been included in a sealed envelope for you to review at your convenience later.  YOU SHOULD EXPECT: Some feelings of bloating in the abdomen. Passage of more gas than usual.  Walking can help get rid of the air that was put into your GI tract during the procedure and reduce the bloating. If you had a lower endoscopy (such as a colonoscopy or flexible sigmoidoscopy) you may notice spotting of blood in your stool or on the toilet paper. If you underwent a bowel prep for your procedure, then you may not have a normal bowel movement for a few days.  DIET: Your first meal following the procedure should be a light meal and then it is ok to progress to your normal diet.  A half-sandwich or bowl of soup is an example of a good first meal.  Heavy or fried foods are harder to digest and may make you feel nauseous or bloated.  Likewise meals heavy in dairy and vegetables can cause extra gas to form and this can also increase the bloating.  Drink plenty of fluids but you should avoid alcoholic beverages for 24 hours.  ACTIVITY: Your care partner should take you home directly after the procedure.  You should plan to take it easy, moving slowly for the rest of the day.  You can resume normal activity the day after the procedure however you should NOT DRIVE or use heavy machinery for 24 hours (because of the sedation medicines used during the test).    SYMPTOMS TO REPORT IMMEDIATELY: A gastroenterologist can be reached at any hour.  During normal business hours, 8:30 AM to 5:00 PM Monday through Friday,  call [phone].  After hours and on weekends, please call the GI answering service at [phone] who will take a message and have the physician on call contact you.   Following lower endoscopy (colonoscopy or flexible sigmoidoscopy):  Excessive amounts of blood in the stool  Significant tenderness or worsening of abdominal pains  Swelling of the abdomen that is new, acute  Fever of 100F or higher  FOLLOW UP: If any biopsies were taken you will be contacted by phone or by letter within the next 1-3 weeks.  Call your gastroenterologist if you have not heard about the biopsies in 3 weeks.  Our staff will call the home number listed on your records the next business day following your procedure to check on you and address any questions or concerns that you may have at that time regarding the information given to you following your procedure. This is a courtesy call and so if there is no answer at the home number and we have not heard from you through the emergency physician on call, we will assume that you have returned to your regular daily activities without incident.  SIGNATURES/CONFIDENTIALITY: You and/or your care partner have signed paperwork which will be entered into your electronic medical record.  These signatures attest to the fact that that the information above on your After Visit Summary has been reviewed and is understood.  Full responsibility of the confidentiality of this   discharge information lies with you and/or your care-partner.  Repeat colonoscopy in 5 years 

## 2012-11-24 NOTE — Progress Notes (Signed)
Patient did not have preoperative order for IV antibiotic SSI prophylaxis. (G8918)  Patient did not experience any of the following events: a burn prior to discharge; a fall within the facility; wrong site/side/patient/procedure/implant event; or a hospital transfer or hospital admission upon discharge from the facility. (G8907)  

## 2012-11-24 NOTE — Op Note (Signed)
[facility] [address] [place], [address]   COLONOSCOPY PROCEDURE REPORT  PATIENT: Luke, [name]  MR#: [id number] BIRTHDATE: February 15, 1971 , 41  yrs. 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       The LB [id number]  endoscope was introduced through the anus and advanced to the cecum, which was identified by both the appendix and ileocecal valve. No adverse events experienced.   The quality of the prep was good.  The instrument was then slowly withdrawn as the colon was fully examined.   COLON FINDINGS: A normal appearing cecum, ileocecal valve, and appendiceal orifice were identified.  The ascending, hepatic flexure, transverse, splenic flexure, descending, sigmoid colon and rectum appeared unremarkable.  No polyps or cancers were seen. Retroflexed views revealed no abnormalities. The time to cecum=6 minutes 22 seconds.  Withdrawal time=11 minutes 11 seconds.  The scope was withdrawn and the procedure completed. COMPLICATIONS: There were no complications.  ENDOSCOPIC IMPRESSION: Normal colon No polyps or cancers  RECOMMENDATIONS: Given your significant family history of colon cancer, you should have a repeat colonoscopy in 5 years.  Will consider MAC sedation with your next examination.   eSigned:  Rachael Fee, MD 11/24/2012 9:13 AM   cc: Tillman Abide, MD

## 2012-11-25 ENCOUNTER — Other Ambulatory Visit (INDEPENDENT_AMBULATORY_CARE_PROVIDER_SITE_OTHER): Payer: Self-pay

## 2012-11-25 ENCOUNTER — Telehealth: Payer: Self-pay | Admitting: *Deleted

## 2012-11-25 ENCOUNTER — Telehealth: Payer: Self-pay | Admitting: Internal Medicine

## 2012-11-25 MED ORDER — MORPHINE SULFATE ER 30 MG PO TBCR: 30.0000 mg | EXTENDED_RELEASE_TABLET | Freq: Two times a day (BID) | ORAL | Status: DC

## 2012-11-25 NOTE — Telephone Encounter (Signed)
  Follow up Call-  Call back number 11/24/2012  Post procedure Call Back phone  # [phone]  Permission to leave phone message Yes     Patient questions:  Do you have a fever, pain , or abdominal swelling? no Pain Score  0 *  Have you tolerated food without any problems? yes  Have you been able to return to your normal activities? yes  Do you have any questions about your discharge instructions: Diet   no Medications  no Follow up visit  no  Do you have questions or concerns about your Care? no  Actions: * If pain score is 4 or above: No action needed, pain <4.

## 2012-11-25 NOTE — Telephone Encounter (Signed)
Sent my-chart that rx ready for pick-up

## 2012-11-25 NOTE — Telephone Encounter (Signed)
See MyChart messages Already out of fentanyl----may have had some stolen Will need to sign revised agreement and be tested  Changing to oral therapy to get over the problems with the patches  Let him know Rx is ready

## 2012-11-27 ENCOUNTER — Encounter: Payer: Self-pay | Admitting: Internal Medicine

## 2012-11-29 ENCOUNTER — Telehealth: Payer: Self-pay | Admitting: Internal Medicine

## 2012-11-29 MED ORDER — FENTANYL 25 MCG/HR TD PT72: 2.0000 | MEDICATED_PATCH | TRANSDERMAL | Status: DC

## 2012-11-29 NOTE — Telephone Encounter (Signed)
See MyChart messages.

## 2012-11-30 ENCOUNTER — Other Ambulatory Visit: Payer: Self-pay | Admitting: Internal Medicine

## 2012-11-30 MED ORDER — ALPRAZOLAM 0.5 MG PO TABS: 0.5000 mg | ORAL_TABLET | Freq: Three times a day (TID) | ORAL | Status: DC | PRN

## 2012-11-30 NOTE — Telephone Encounter (Signed)
Medication phoned to pharmacy.  

## 2012-11-30 NOTE — Telephone Encounter (Signed)
Last filled 10/20/12 

## 2012-11-30 NOTE — Telephone Encounter (Signed)
Okay #60 x 0 

## 2012-12-01 ENCOUNTER — Ambulatory Visit (INDEPENDENT_AMBULATORY_CARE_PROVIDER_SITE_OTHER): Payer: Managed Care, Other (non HMO) | Admitting: Psychology

## 2012-12-01 DIAGNOSIS — F331 Major depressive disorder, recurrent, moderate: Secondary | ICD-10-CM

## 2012-12-02 ENCOUNTER — Encounter: Payer: Self-pay | Admitting: Internal Medicine

## 2012-12-06 ENCOUNTER — Other Ambulatory Visit: Payer: Self-pay | Admitting: *Deleted

## 2012-12-06 ENCOUNTER — Ambulatory Visit (INDEPENDENT_AMBULATORY_CARE_PROVIDER_SITE_OTHER): Payer: Managed Care, Other (non HMO) | Admitting: Internal Medicine

## 2012-12-06 ENCOUNTER — Encounter: Payer: Self-pay | Admitting: Internal Medicine

## 2012-12-06 VITALS — BP 120/80 | HR 99 | Temp 97.9°F | Wt 197.0 lb

## 2012-12-06 DIAGNOSIS — M170 Bilateral primary osteoarthritis of knee: Secondary | ICD-10-CM

## 2012-12-06 DIAGNOSIS — M1710 Unilateral primary osteoarthritis, unspecified knee: Secondary | ICD-10-CM

## 2012-12-06 DIAGNOSIS — F339 Major depressive disorder, recurrent, unspecified: Secondary | ICD-10-CM

## 2012-12-06 DIAGNOSIS — J019 Acute sinusitis, unspecified: Secondary | ICD-10-CM

## 2012-12-06 MED ORDER — DULOXETINE HCL 30 MG PO CPEP: 90.0000 mg | ORAL_CAPSULE | Freq: Every day | ORAL | Status: DC

## 2012-12-06 MED ORDER — AMOXICILLIN 500 MG PO TABS: 1000.0000 mg | ORAL_TABLET | Freq: Two times a day (BID) | ORAL | Status: DC

## 2012-12-06 NOTE — Assessment & Plan Note (Signed)
Now off narcotics Seems to be doing okay  Just aleve for now

## 2012-12-06 NOTE — Assessment & Plan Note (Signed)
Has been stressed with separation, weaning off narcotics, etc Now on increased cymbalta and remains on paxil Will review at his follow up

## 2012-12-06 NOTE — Progress Notes (Signed)
Subjective:    Patient ID: Luke [name], male    DOB: 10-Jun-1971, 42 y.o.   MRN: [id number]  HPI Started with sore throat ~5-6 days ago Then worsened---pain with swallowing Trying gargling with salt water and listerine with slight relief Body aches started 2 days ago---felt feverish Started coughing which seemed to be from drainage White nasal drainage and white sputum Energy off  Trying tylenol cold-some help No SOB No ear pain  Stopped the fentanyl completely now Just on aleve Did increase the cymbalta to 90 mg daily  Current Outpatient Prescriptions on File Prior to Visit  Medication Sig Dispense Refill  . ALPRAZolam (XANAX) 0.5 MG tablet Take 1 tablet (0.5 mg total) by mouth 3 (three) times daily as needed for anxiety (panic attack ).  60 tablet  0  . aspirin 81 MG tablet Take 81 mg by mouth daily.        . Coenzyme Q10 (CO Q 10) 100 MG CAPS Take 1 capsule by mouth daily.      Marland Kitchen lidocaine (LIDODERM) 5 % Place 1 patch onto the skin daily. Remove & Discard patch within 12 hours or as directed by MD  30 patch  5  . methylphenidate (RITALIN) 5 MG tablet Take 1 tablet (5 mg total) by mouth 2 (two) times daily.  60 tablet  0  . MULTIPLE VITAMINS PO Take 2 tablets by mouth daily.      Marland Kitchen PARoxetine (PAXIL) 40 MG tablet Take 1 tablet (40 mg total) by mouth daily.  30 tablet  11  . vitamin B-12 (CYANOCOBALAMIN) 1000 MCG tablet Take 1,000 mcg by mouth daily.         No current facility-administered medications on file prior to visit.    Allergies  Allergen Reactions  . Buspirone Hcl     REACTION: unspecified  . Fluoxetine Hcl     REACTION: unspecified  . Olanzapine     REACTION: unspecified  . Hydrocodone     Makes him irritable    Past Medical History  Diagnosis Date  . Depression   . Anxiety   . OA (osteoarthritis)   . Adenomatous colon polyp   . Inguinal hernia     Past Surgical History  Procedure Laterality Date  . Appendectomy  1983  . Inguinal hernia repair   1992    right  . Wrist surgery  01/2007    right  . Knee arthroscopy  ~1995 and 2005    Right  . Knee arthroscopy  10/2008    Left ([place])  . Laparoscopic inguinal hernia repair  10/13    left    Family History  Problem Relation Age of Onset  . Cancer Father     prostate  . Lymphoma Brother     Hodgkin's  . Hodgkin's lymphoma Brother   . Cancer Paternal Uncle     colon  . Colon polyps Paternal Uncle   . Heart attack Paternal Grandfather     ??  . Esophageal cancer Neg Hx   . Rectal cancer Neg Hx   . Stomach cancer Neg Hx     History   Social History  . Marital Status: Married    Spouse Name: N/A    Number of Children: 1  . 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 Smoking status: Never Smoker   . Smokeless tobacco: Never Used  . Alcohol  Use: No  . Drug Use: No  . Sexually Active: Not on file   Other Topics Concern  . Not on file   Social History Narrative   Divorced then remarried 2010   Has son and now daughter with second wife   Review of Systems No heartburn or nausea No vomiting or diarrhea Appetite still off    Objective:   Physical Exam  Constitutional: He appears well-developed and well-nourished. No distress.  HENT:  Right Ear: External ear normal.  Left Ear: External ear normal.  Mouth/Throat: Oropharynx is clear and moist. No oropharyngeal exudate.  TMs normal Mild nasal congestion No sinus tenderness  Neck: Normal range of motion. Neck supple. No thyromegaly present.  Pulmonary/Chest: Effort normal and breath sounds normal. No respiratory distress. He has no wheezes. He has no rales.  Lymphadenopathy:    He has no cervical adenopathy.  Psychiatric:  Affect is flat No overt depression          Assessment & Plan:

## 2012-12-06 NOTE — Patient Instructions (Signed)
Please start the amoxicillin antibiotic if you get worse as the week goes on.

## 2012-12-06 NOTE — Assessment & Plan Note (Signed)
Still seems like a viral infection Discussed symptomatic Rx Amoxil if worsens

## 2012-12-09 ENCOUNTER — Ambulatory Visit (INDEPENDENT_AMBULATORY_CARE_PROVIDER_SITE_OTHER): Payer: Managed Care, Other (non HMO) | Admitting: Internal Medicine

## 2012-12-09 ENCOUNTER — Encounter: Payer: Self-pay | Admitting: Internal Medicine

## 2012-12-09 VITALS — BP 142/96 | HR 106 | Temp 97.8°F | Wt 194.5 lb

## 2012-12-09 DIAGNOSIS — R079 Chest pain, unspecified: Secondary | ICD-10-CM

## 2012-12-09 DIAGNOSIS — F1193 Opioid use, unspecified with withdrawal: Secondary | ICD-10-CM | POA: Insufficient documentation

## 2012-12-09 DIAGNOSIS — F1120 Opioid dependence, uncomplicated: Secondary | ICD-10-CM

## 2012-12-09 DIAGNOSIS — F1123 Opioid dependence with withdrawal: Secondary | ICD-10-CM | POA: Insufficient documentation

## 2012-12-09 MED ORDER — BACLOFEN 10 MG PO TABS: 5.0000 mg | ORAL_TABLET | Freq: Three times a day (TID) | ORAL | Status: DC | PRN

## 2012-12-09 MED ORDER — CLONIDINE HCL 0.1 MG PO TABS: 0.1000 mg | ORAL_TABLET | Freq: Three times a day (TID) | ORAL | Status: DC

## 2012-12-09 NOTE — Assessment & Plan Note (Addendum)
Not unexpected but he rushed the fentanyl wean so it may be more apparent Tachycardic and diaphoretic but had still been working EKG normal except for sinus tach  Will try clonidine for the adrenergic symptoms Baclofen for his aching See back in 5 days ER if much worse

## 2012-12-09 NOTE — Patient Instructions (Signed)
Please take the clonidine-- 1 tab three times a day. If the rapid heart and sweating don't get significantly better, increase the dose to 2 tabs three times a day. You can use the baclofen 1/2-1 tab up to three times a day for muscle aching and pain.

## 2012-12-09 NOTE — Progress Notes (Signed)
Subjective:    Patient ID: Luke [name], male    DOB: 15-Jan-1971, 42 y.o.   MRN: [id number]  HPI Weaned himself off the fentanyl fairly fast Last dose was 5/17 Started feeling real bad 5/20 in evening Couldn't sleep Shaky, headaches Getting upset, agitated easily  Reported chest pain in the waiting room (started ~12noon) Lots of sweating--cold shower helped only briefly Some skips in heart Some SOB  Legs aching last night--not as much today Used OTC rub  Has just taken aleve for pain  Current Outpatient Prescriptions on File Prior to Visit  Medication Sig Dispense Refill  . ALPRAZolam (XANAX) 0.5 MG tablet Take 1 tablet (0.5 mg total) by mouth 3 (three) times daily as needed for anxiety (panic attack ).  60 tablet  0  . amoxicillin (AMOXIL) 500 MG tablet Take 2 tablets (1,000 mg total) by mouth 2 (two) times daily.  40 tablet  0  . aspirin 81 MG tablet Take 81 mg by mouth daily.        . Coenzyme Q10 (CO Q 10) 100 MG CAPS Take 1 capsule by mouth daily.      . DULoxetine (CYMBALTA) 30 MG capsule Take 3 capsules (90 mg total) by mouth daily.  90 capsule  3  . lidocaine (LIDODERM) 5 % Place 1 patch onto the skin daily. Remove & Discard patch within 12 hours or as directed by MD  30 patch  5  . methylphenidate (RITALIN) 5 MG tablet Take 1 tablet (5 mg total) by mouth 2 (two) times daily.  60 tablet  0  . MULTIPLE VITAMINS PO Take 2 tablets by mouth daily.      Marland Kitchen PARoxetine (PAXIL) 40 MG tablet Take 1 tablet (40 mg total) by mouth daily.  30 tablet  11  . vitamin B-12 (CYANOCOBALAMIN) 1000 MCG tablet Take 1,000 mcg by mouth daily.         No current facility-administered medications on file prior to visit.    Allergies  Allergen Reactions  . Buspirone Hcl     REACTION: unspecified  . Fluoxetine Hcl     REACTION: unspecified  . Olanzapine     REACTION: unspecified  . Hydrocodone     Makes him irritable    Past Medical History  Diagnosis Date  . Depression   . Anxiety    . OA (osteoarthritis)   . Adenomatous colon polyp   . Inguinal hernia     Past Surgical History  Procedure Laterality Date  . Appendectomy  1983  . Inguinal hernia repair  1992    right  . Wrist surgery  01/2007    right  . Knee arthroscopy  ~1995 and 2005    Right  . Knee arthroscopy  10/2008    Left ([place])  . Laparoscopic inguinal hernia repair  10/13    left    Family History  Problem Relation Age of Onset  . Cancer Father     prostate  . Lymphoma Brother     Hodgkin's  . Hodgkin's lymphoma Brother   . Cancer Paternal Uncle     colon  . Colon polyps Paternal Uncle   . Heart attack Paternal Grandfather     ??  . Esophageal cancer Neg Hx   . Rectal cancer Neg Hx   . Stomach cancer Neg Hx     History   Social History  . Marital Status: Married    Spouse Name: N/A    Number of Children: 1  .  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 Smoking status: Never Smoker   . Smokeless tobacco: Never Used  . Alcohol Use: No  . Drug Use: No  . Sexually Active: Not on file   Other Topics Concern  . Not on file   Social History Narrative   Divorced then remarried 2010   Has son and now daughter with second wife   Review of Systems Very little eating---no appetite Tried nutritional drink No vomiting Some loose stool yesterday     Objective:   Physical Exam  Constitutional: He appears well-developed and well-nourished.  Mild diaphoresis Slightly uncomfortable but not in distress  Neck: Normal range of motion. Neck supple. No thyromegaly present.  Cardiovascular: Regular rhythm and normal heart sounds.  Exam reveals no gallop.   No murmur heard. tachycardic  Pulmonary/Chest: Effort normal. No respiratory distress. He has no wheezes. He has no rales.  Abdominal: Soft. There is no tenderness.  Musculoskeletal: He exhibits no edema and no tenderness.  Lymphadenopathy:    He  has no cervical adenopathy.  Psychiatric: He has a normal mood and affect. His behavior is normal.          Assessment & Plan:

## 2012-12-10 ENCOUNTER — Encounter: Payer: Self-pay | Admitting: Radiology

## 2012-12-12 ENCOUNTER — Encounter: Payer: Self-pay | Admitting: Internal Medicine

## 2012-12-14 ENCOUNTER — Ambulatory Visit (INDEPENDENT_AMBULATORY_CARE_PROVIDER_SITE_OTHER): Payer: Managed Care, Other (non HMO) | Admitting: Internal Medicine

## 2012-12-14 ENCOUNTER — Ambulatory Visit: Payer: Managed Care, Other (non HMO) | Admitting: Internal Medicine

## 2012-12-14 ENCOUNTER — Encounter: Payer: Self-pay | Admitting: Internal Medicine

## 2012-12-14 VITALS — BP 110/80 | HR 78 | Temp 97.7°F | Wt 207.0 lb

## 2012-12-14 DIAGNOSIS — F1123 Opioid dependence with withdrawal: Secondary | ICD-10-CM

## 2012-12-14 DIAGNOSIS — F1120 Opioid dependence, uncomplicated: Secondary | ICD-10-CM

## 2012-12-14 NOTE — Progress Notes (Signed)
Subjective:    Patient ID: Luke [name], male    DOB: 14-Jun-1971, 42 y.o.   MRN: [id number]  HPI Had a hard time with the withdrawal Tried the clonidine but then worsened---so went up to 2 tabs tid---was making him tired and not clearly helping Using the baclofen regularly but still had terrible leg cramps  Was pacing and couldn't get comfortable Did apply 25 mcg fentanyl about 36 hours ago Got an emergency call and worked yesterday--- the patch then came off last night Now knows the withdrawal will start again soon  Current Outpatient Prescriptions on File Prior to Visit  Medication Sig Dispense Refill  . ALPRAZolam (XANAX) 0.5 MG tablet Take 1 tablet (0.5 mg total) by mouth 3 (three) times daily as needed for anxiety (panic attack ).  60 tablet  0  . aspirin 81 MG tablet Take 81 mg by mouth daily.        . baclofen (LIORESAL) 10 MG tablet Take 0.5-1 tablets (5-10 mg total) by mouth 3 (three) times daily as needed.  30 each  0  . cloNIDine (CATAPRES) 0.1 MG tablet Take 1-2 tablets (0.1-0.2 mg total) by mouth 3 (three) times daily.  60 tablet  0  . Coenzyme Q10 (CO Q 10) 100 MG CAPS Take 1 capsule by mouth daily.      . DULoxetine (CYMBALTA) 30 MG capsule Take 3 capsules (90 mg total) by mouth daily.  90 capsule  3  . lidocaine (LIDODERM) 5 % Place 1 patch onto the skin daily. Remove & Discard patch within 12 hours or as directed by MD  30 patch  5  . methylphenidate (RITALIN) 5 MG tablet Take 1 tablet (5 mg total) by mouth 2 (two) times daily.  60 tablet  0  . MULTIPLE VITAMINS PO Take 2 tablets by mouth daily.      Marland Kitchen PARoxetine (PAXIL) 40 MG tablet Take 1 tablet (40 mg total) by mouth daily.  30 tablet  11  . vitamin B-12 (CYANOCOBALAMIN) 1000 MCG tablet Take 1,000 mcg by mouth daily.         No current facility-administered medications on file prior to visit.    Allergies  Allergen Reactions  . Buspirone Hcl     REACTION: unspecified  . Fluoxetine Hcl     REACTION: unspecified   . Olanzapine     REACTION: unspecified  . Hydrocodone     Makes him irritable    Past Medical History  Diagnosis Date  . Depression   . Anxiety   . OA (osteoarthritis)   . Adenomatous colon polyp   . Inguinal hernia     Past Surgical History  Procedure Laterality Date  . Appendectomy  1983  . Inguinal hernia repair  1992    right  . Wrist surgery  01/2007    right  . Knee arthroscopy  ~1995 and 2005    Right  . Knee arthroscopy  10/2008    Left ([place])  . Laparoscopic inguinal hernia repair  10/13    left    Family History  Problem Relation Age of Onset  . Cancer Father     prostate  . Lymphoma Brother     Hodgkin's  . Hodgkin's lymphoma Brother   . Cancer Paternal Uncle     colon  . Colon polyps Paternal Uncle   . Heart attack Paternal Grandfather     ??  . Esophageal cancer Neg Hx   . Rectal cancer Neg Hx   .  Stomach cancer Neg Hx     History   Social History  . Marital Status: Married    Spouse Name: N/A    Number of Children: 1  . 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 Smoking status: Never Smoker   . Smokeless tobacco: Never Used  . Alcohol Use: No  . Drug Use: No  . Sexually Active: Not on file   Other Topics Concern  . Not on file   Social History Narrative   Divorced then remarried 2010   Has son and now daughter with second wife   Review of Systems Some dizziness while working---had to hold onto counter Eating very little---weight up some (partially from his work boots)    Objective:   Physical Exam  Constitutional: He appears well-developed and well-nourished. No distress.  Psychiatric: He has a normal mood and affect. His behavior is normal.          Assessment & Plan:

## 2012-12-14 NOTE — Assessment & Plan Note (Signed)
Got bad and used last fentanyl 2 nights ago Now off again for 8 hours or so No withdrawal again yet Will continue the clonidine and baclofen  If unable to tolerate wean, may need to refill the fentanyl again at 25 only (hasn't tolerated oral long acting narcotics)

## 2012-12-15 ENCOUNTER — Encounter: Payer: Self-pay | Admitting: Internal Medicine

## 2012-12-15 ENCOUNTER — Ambulatory Visit: Payer: Managed Care, Other (non HMO) | Admitting: Psychology

## 2012-12-16 ENCOUNTER — Telehealth: Payer: Self-pay | Admitting: *Deleted

## 2012-12-16 ENCOUNTER — Encounter: Payer: Self-pay | Admitting: Internal Medicine

## 2012-12-16 ENCOUNTER — Inpatient Hospital Stay: Payer: Self-pay | Admitting: Psychiatry

## 2012-12-16 LAB — URINALYSIS, COMPLETE
Bacteria: NONE SEEN
Bacteria: NONE SEEN
Bilirubin,UR: NEGATIVE
Blood: NEGATIVE
Blood: NEGATIVE
Glucose,UR: NEGATIVE mg/dL (ref 0–75)
Glucose,UR: NEGATIVE mg/dL (ref 0–75)
Ketone: NEGATIVE
Leukocyte Esterase: NEGATIVE
Leukocyte Esterase: NEGATIVE
Nitrite: NEGATIVE
Nitrite: NEGATIVE
Ph: 6 (ref 4.5–8.0)
Ph: 7 (ref 4.5–8.0)
Protein: NEGATIVE
Protein: NEGATIVE
RBC,UR: NONE SEEN /HPF (ref 0–5)
Specific Gravity: 1.005 (ref 1.003–1.030)
Squamous Epithelial: <1
Squamous Epithelial: NONE SEEN
WBC UR: <1 /HPF (ref 0–5)
WBC UR: <1 /HPF (ref 0–5)

## 2012-12-16 LAB — DRUG SCREEN, URINE
Amphetamines, Ur Screen: NEGATIVE (ref ?–1000)
Cannabinoid 50 Ng, Ur ~~LOC~~: NEGATIVE (ref ?–50)
MDMA (Ecstasy)Ur Screen: NEGATIVE (ref ?–500)
Methadone, Ur Screen: NEGATIVE (ref ?–300)
Opiate, Ur Screen: POSITIVE (ref ?–300)
Phencyclidine (PCP) Ur S: NEGATIVE (ref ?–25)
Tricyclic, Ur Screen: NEGATIVE (ref ?–1000)

## 2012-12-16 LAB — COMPREHENSIVE METABOLIC PANEL
Albumin: 3.7 g/dL (ref 3.4–5.0)
Alkaline Phosphatase: 65 U/L (ref 50–136)
Anion Gap: 5 — ABNORMAL LOW (ref 7–16)
BUN: 12 mg/dL (ref 7–18)
Bilirubin,Total: 0.3 mg/dL (ref 0.2–1.0)
Calcium, Total: 9.2 mg/dL (ref 8.5–10.1)
Co2: 30 mmol/L (ref 21–32)
EGFR (African American): >60
Glucose: 127 mg/dL — ABNORMAL HIGH (ref 65–99)
Potassium: 4.6 mmol/L (ref 3.5–5.1)
SGPT (ALT): 22 U/L (ref 12–78)

## 2012-12-16 LAB — CBC
HCT: 36.9 % — ABNORMAL LOW (ref 40.0–52.0)
HGB: 12.5 g/dL — ABNORMAL LOW (ref 13.0–18.0)
MCH: 29.5 pg (ref 26.0–34.0)
MCV: 87 fL (ref 80–100)
Platelet: 424 10*3/uL (ref 150–440)
RDW: 14 % (ref 11.5–14.5)
WBC: 7.5 10*3/uL (ref 3.8–10.6)

## 2012-12-16 LAB — ETHANOL: Ethanol: <3 mg/dL

## 2012-12-16 LAB — TSH: Thyroid Stimulating Horm: 1.08 u[IU]/mL

## 2012-12-16 NOTE — Telephone Encounter (Signed)
Okay, that is where he needs to be

## 2012-12-16 NOTE — Telephone Encounter (Signed)
Called pt's cell phone and he's in a room at [facility] ED waiting to be seen, he's said he will keep [place] updated.

## 2012-12-16 NOTE — Telephone Encounter (Signed)
Please see if you can find out how he is doing

## 2012-12-16 NOTE — Telephone Encounter (Signed)
Patient's mother walked in asking for help with patient, per mom she literally has to hold him down, she want to take him to [facility] ED and wanted me to call pt and let him know that's where he needs to go and she will drive him. I called home number and told pt to get dressed to go to [facility], he agreed and stated he will also call Dr.Perrin to let her know. I advised pt and mom to call us if they need [place] for anything.

## 2012-12-17 LAB — BEHAVIORAL MEDICINE 1 PANEL
Albumin: 3.7 g/dL (ref 3.4–5.0)
Alkaline Phosphatase: 65 U/L (ref 50–136)
Anion Gap: 3 — ABNORMAL LOW (ref 7–16)
BUN: 10 mg/dL (ref 7–18)
Basophil #: 0 10*3/uL (ref 0.0–0.1)
Basophil %: 0.3 %
Bilirubin,Total: 0.3 mg/dL (ref 0.2–1.0)
Calcium, Total: 9.4 mg/dL (ref 8.5–10.1)
Chloride: 104 mmol/L (ref 98–107)
Co2: 31 mmol/L (ref 21–32)
Creatinine: 0.92 mg/dL (ref 0.60–1.30)
EGFR (African American): >60
EGFR (Non-African Amer.): >60
Eosinophil #: 0.1 10*3/uL (ref 0.0–0.7)
Eosinophil %: 1.1 %
Glucose: 111 mg/dL — ABNORMAL HIGH (ref 65–99)
HCT: 38.4 % — ABNORMAL LOW (ref 40.0–52.0)
HGB: 13.3 g/dL (ref 13.0–18.0)
Lymphocyte #: 2 10*3/uL (ref 1.0–3.6)
Lymphocyte %: 21 %
MCH: 29.8 pg (ref 26.0–34.0)
MCHC: 34.6 g/dL (ref 32.0–36.0)
MCV: 86 fL (ref 80–100)
Monocyte #: 0.6 x10 3/mm (ref 0.2–1.0)
Monocyte %: 6.3 %
Neutrophil #: 6.8 10*3/uL — ABNORMAL HIGH (ref 1.4–6.5)
Neutrophil %: 71.3 %
Osmolality: 275 (ref 275–301)
Platelet: 412 10*3/uL (ref 150–440)
Potassium: 4.5 mmol/L (ref 3.5–5.1)
RBC: 4.46 10*6/uL (ref 4.40–5.90)
RDW: 13.7 % (ref 11.5–14.5)
SGOT(AST): 19 U/L (ref 15–37)
SGPT (ALT): 26 U/L (ref 12–78)
Sodium: 138 mmol/L (ref 136–145)
Thyroid Stimulating Horm: 0.474 u[IU]/mL
Total Protein: 7.6 g/dL (ref 6.4–8.2)
WBC: 9.5 10*3/uL (ref 3.8–10.6)

## 2012-12-21 ENCOUNTER — Encounter: Payer: Self-pay | Admitting: Internal Medicine

## 2012-12-21 ENCOUNTER — Ambulatory Visit (INDEPENDENT_AMBULATORY_CARE_PROVIDER_SITE_OTHER): Payer: Managed Care, Other (non HMO) | Admitting: Internal Medicine

## 2012-12-21 VITALS — BP 128/70 | HR 91 | Temp 98.4°F | Wt 196.0 lb

## 2012-12-21 DIAGNOSIS — F1193 Opioid use, unspecified with withdrawal: Secondary | ICD-10-CM

## 2012-12-21 DIAGNOSIS — F411 Generalized anxiety disorder: Secondary | ICD-10-CM

## 2012-12-21 DIAGNOSIS — F1120 Opioid dependence, uncomplicated: Secondary | ICD-10-CM

## 2012-12-21 DIAGNOSIS — M170 Bilateral primary osteoarthritis of knee: Secondary | ICD-10-CM

## 2012-12-21 DIAGNOSIS — F1123 Opioid dependence with withdrawal: Secondary | ICD-10-CM

## 2012-12-21 DIAGNOSIS — M1710 Unilateral primary osteoarthritis, unspecified knee: Secondary | ICD-10-CM

## 2012-12-21 MED ORDER — METHYLPHENIDATE HCL 5 MG PO TABS: 5.0000 mg | ORAL_TABLET | Freq: Two times a day (BID) | ORAL | Status: DC

## 2012-12-21 NOTE — Assessment & Plan Note (Signed)
Will consider steroid injections

## 2012-12-21 NOTE — Assessment & Plan Note (Signed)
Continues on his paroxetine Xanax prn

## 2012-12-21 NOTE — Progress Notes (Signed)
Subjective:    Patient ID: Luke [name], male    DOB: [birth year], 42 y.o.   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 ALPRAZolam (XANAX) 0.5 MG tablet Take 1 tablet (0.5 mg total) by mouth 3 (three) times daily as needed for anxiety (panic attack ).  60 tablet  0  . aspirin 81 MG tablet Take 81 mg by mouth daily.        . Coenzyme Q10 (CO Q 10) 100 MG CAPS Take 1 capsule by mouth daily.      . DULoxetine (CYMBALTA) 30 MG capsule Take 3 capsules (90 mg total) by mouth daily.  90 capsule  3  . lidocaine (LIDODERM) 5 % Place 1 patch onto the skin daily. Remove & Discard patch within 12 hours or as directed by MD  30 patch  5  . methylphenidate (RITALIN) 5 MG tablet Take 1 tablet (5 mg total) by mouth 2 (two) times daily.  60 tablet  0  . MULTIPLE VITAMINS PO Take 2 tablets by mouth daily.      Marland Kitchen PARoxetine (PAXIL) 40 MG tablet Take 1 tablet (40 mg total) by mouth daily.  30 tablet  11  . vitamin B-12 (CYANOCOBALAMIN) 1000 MCG tablet Take 1,000 mcg by mouth daily.         No current facility-administered medications on file prior to visit.    Allergies  Allergen Reactions  . Buspirone Hcl     REACTION: unspecified  . Fluoxetine Hcl     REACTION: unspecified  . Olanzapine     REACTION: unspecified  . Hydrocodone     Makes him irritable    Past Medical History  Diagnosis Date  . Depression   . Anxiety   . OA (osteoarthritis)   . Adenomatous colon polyp   . Inguinal hernia     Past Surgical History  Procedure Laterality Date  . Appendectomy  1983  . Inguinal hernia repair  1992    right  . Wrist surgery  01/2007    right   . Knee arthroscopy  ~1995 and 2005    Right  . Knee arthroscopy  10/2008    Left ([place])  . Laparoscopic inguinal hernia repair  10/13    left    Family History  Problem Relation Age of Onset  . Cancer Father     prostate  . Lymphoma Brother     Hodgkin's  . Hodgkin's lymphoma Brother   . Cancer Paternal Uncle     colon  . Colon polyps Paternal Uncle   . Heart attack Paternal Grandfather     ??  . Esophageal cancer Neg Hx   . Rectal cancer Neg Hx   . Stomach cancer Neg Hx     History   Social History  . Marital Status: Married    Spouse Name: N/A    Number of Children: 1  . 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 Smoking status: Never Smoker   . Smokeless tobacco: Never Used  . Alcohol  Use: No  . Drug Use: No  . Sexually Active: Not on file   Other Topics Concern  . Not on file   Social History Narrative   Divorced then remarried 2010   Has son and now daughter with second wife   Review of Systems Didn't sleep for 2 days when he was in the hospital--did sleep some last night Appetite is slowly improving    Objective:   Physical Exam  Psychiatric: He has a normal mood and affect. His behavior is normal.          Assessment & Plan:

## 2012-12-21 NOTE — Assessment & Plan Note (Signed)
Now detoxed Hopefully will get his energy levels back quickly

## 2012-12-21 NOTE — Patient Instructions (Signed)
Please try melatonin 3-5mg  at bedtime to try to help sleep

## 2012-12-22 ENCOUNTER — Ambulatory Visit (INDEPENDENT_AMBULATORY_CARE_PROVIDER_SITE_OTHER): Payer: Managed Care, Other (non HMO) | Admitting: Psychology

## 2012-12-22 DIAGNOSIS — F331 Major depressive disorder, recurrent, moderate: Secondary | ICD-10-CM

## 2012-12-24 ENCOUNTER — Encounter: Payer: Self-pay | Admitting: Internal Medicine

## 2012-12-27 ENCOUNTER — Encounter: Payer: Self-pay | Admitting: Internal Medicine

## 2012-12-27 ENCOUNTER — Telehealth: Payer: Self-pay | Admitting: Internal Medicine

## 2012-12-27 DIAGNOSIS — F1123 Opioid dependence with withdrawal: Secondary | ICD-10-CM

## 2012-12-27 DIAGNOSIS — M170 Bilateral primary osteoarthritis of knee: Secondary | ICD-10-CM

## 2012-12-27 NOTE — Telephone Encounter (Signed)
I have put in referral 

## 2012-12-29 ENCOUNTER — Ambulatory Visit (INDEPENDENT_AMBULATORY_CARE_PROVIDER_SITE_OTHER): Payer: Managed Care, Other (non HMO) | Admitting: Psychology

## 2012-12-29 DIAGNOSIS — F331 Major depressive disorder, recurrent, moderate: Secondary | ICD-10-CM

## 2012-12-31 ENCOUNTER — Encounter (INDEPENDENT_AMBULATORY_CARE_PROVIDER_SITE_OTHER): Payer: Self-pay | Admitting: General Surgery

## 2012-12-31 ENCOUNTER — Encounter: Payer: Self-pay | Admitting: Internal Medicine

## 2012-12-31 ENCOUNTER — Ambulatory Visit (INDEPENDENT_AMBULATORY_CARE_PROVIDER_SITE_OTHER): Payer: Managed Care, Other (non HMO) | Admitting: Internal Medicine

## 2012-12-31 ENCOUNTER — Telehealth (INDEPENDENT_AMBULATORY_CARE_PROVIDER_SITE_OTHER): Payer: Self-pay | Admitting: General Surgery

## 2012-12-31 VITALS — BP 110/70 | HR 102 | Temp 98.2°F | Wt 192.0 lb

## 2012-12-31 DIAGNOSIS — M1710 Unilateral primary osteoarthritis, unspecified knee: Secondary | ICD-10-CM

## 2012-12-31 DIAGNOSIS — IMO0002 Reserved for concepts with insufficient information to code with codable children: Secondary | ICD-10-CM

## 2012-12-31 DIAGNOSIS — R1032 Left lower quadrant pain: Secondary | ICD-10-CM | POA: Insufficient documentation

## 2012-12-31 DIAGNOSIS — R000 Tachycardia, unspecified: Secondary | ICD-10-CM

## 2012-12-31 DIAGNOSIS — M170 Bilateral primary osteoarthritis of knee: Secondary | ICD-10-CM

## 2012-12-31 NOTE — Assessment & Plan Note (Signed)
Probably some nerve damage from the inguinal hernia repair May need reeval by Dr Derrell Lolling

## 2012-12-31 NOTE — Assessment & Plan Note (Signed)
No longer in withdrawal Will decrease the cymbalta in case that is part of the problem

## 2012-12-31 NOTE — Progress Notes (Signed)
Subjective:    Patient ID: Luke [name], male    DOB: 12-14-1970, 42 y.o.   MRN: [id number]  HPI Here for knee injection  Discussed clonidine mistaken dosing in behavioral medicine  Pain on left side of scrotum and groin since surgery  Still shaky Heart rate remains elevated--over 100 even at rest Having sig tremor in hands also--xanax gives minimal help  Current Outpatient Prescriptions on File Prior to Visit  Medication Sig Dispense Refill  . ALPRAZolam (XANAX) 0.5 MG tablet Take 1 tablet (0.5 mg total) by mouth 3 (three) times daily as needed for anxiety (panic attack ).  60 tablet  0  . aspirin 81 MG tablet Take 81 mg by mouth daily.        . Coenzyme Q10 (CO Q 10) 100 MG CAPS Take 1 capsule by mouth daily.      . DULoxetine (CYMBALTA) 30 MG capsule Take 3 capsules (90 mg total) by mouth daily.  90 capsule  3  . lidocaine (LIDODERM) 5 % Place 1 patch onto the skin daily. Remove & Discard patch within 12 hours or as directed by MD  30 patch  5  . methylphenidate (RITALIN) 5 MG tablet Take 1 tablet (5 mg total) by mouth 2 (two) times daily.  60 tablet  0  . MULTIPLE VITAMINS PO Take 2 tablets by mouth daily.      Marland Kitchen PARoxetine (PAXIL) 40 MG tablet Take 1 tablet (40 mg total) by mouth daily.  30 tablet  11  . vitamin B-12 (CYANOCOBALAMIN) 1000 MCG tablet Take 1,000 mcg by mouth daily.         No current facility-administered medications on file prior to visit.    Allergies  Allergen Reactions  . Buspirone Hcl     REACTION: unspecified  . Fluoxetine Hcl     REACTION: unspecified  . Olanzapine     REACTION: unspecified  . Hydrocodone     Makes him irritable    Past Medical History  Diagnosis Date  . Depression   . Anxiety   . OA (osteoarthritis)   . Adenomatous colon polyp   . Inguinal hernia     Past Surgical History  Procedure Laterality Date  . Appendectomy  1983  . Inguinal hernia repair  1992    right  . Wrist surgery  01/2007    right  . Knee arthroscopy   ~1995 and 2005    Right  . Knee arthroscopy  10/2008    Left ([place])  . Laparoscopic inguinal hernia repair  10/13    left    Family History  Problem Relation Age of Onset  . Cancer Father     prostate  . Lymphoma Brother     Hodgkin's  . Hodgkin's lymphoma Brother   . Cancer Paternal Uncle     colon  . Colon polyps Paternal Uncle   . Heart attack Paternal Grandfather     ??  . Esophageal cancer Neg Hx   . Rectal cancer Neg Hx   . Stomach cancer Neg Hx     History   Social History  . Marital Status: Married    Spouse Name: N/A    Number of Children: 1  . 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 Smoking status: Never Smoker   . Smokeless tobacco: Never Used  . Alcohol Use: No  . Drug Use: No  .  Sexually Active: Not on file   Other Topics Concern  . Not on file   Social History Narrative   Divorced then remarried 2010   Has son and now daughter with second wife   Review of Systems Appetite still poor Not sleeping well unless he takes the trazodone--and not well even then     Objective:   Physical Exam  Constitutional: He appears well-developed and well-nourished. No distress.  Cardiovascular: Normal rate, regular rhythm and normal heart sounds.  Exam reveals no gallop.   No murmur heard. Genitourinary:  Slight defect in left inguinal region but no major bulging  Left scrotum and testes exquisitely tender but no redness or inflammation  Musculoskeletal: He exhibits no edema.  No left knee swelling          Assessment & Plan:

## 2012-12-31 NOTE — Assessment & Plan Note (Signed)
Decided to try injection  PROCEDURE Sterile prep to medial left knee Ethyl chloride then total 5cc 2% lidocaine 3 separate sticks due to trouble accessing joint due to bony tissue---finally fair entry from very inferior approach 40mg  depomedrol and 5cc 2% lido instilled Tolerated well Discussed home care

## 2012-12-31 NOTE — Telephone Encounter (Signed)
  ','<  More Detail >>          Hernia Surgery last year  Joshau Code  MRN: [id number] DOB: 04/16/1971     Pt Home: [phone]     Sent: Caleen Essex December 31, 2012 3:11 PM   Entered: [phone]     To: [facility] Clinical Pool                                 Message    You operated on me for a hernia on my left lower abdomen at the end of last year. I've had a lot of discomfort and attributed it to scar tissue. I had an appointment this morning with Dr. Tillman Abide for a knee injection and asked him about it. He checked and said it felt as if I still had a protrusion into my left testicle. Please contact me.       Thanks,       Luke [name] [phone]         Called to speak with patient and he stated that he was still having pain and discomfort and Dr.Letvak thought that there was still something there...offered patient and appt on 6/16 or 6/19 and the patient chose 6/19 @10 ..he is aware and agreeable

## 2013-01-02 ENCOUNTER — Other Ambulatory Visit: Payer: Self-pay | Admitting: Internal Medicine

## 2013-01-03 MED ORDER — ALPRAZOLAM 0.5 MG PO TABS: 0.5000 mg | ORAL_TABLET | Freq: Three times a day (TID) | ORAL | Status: DC | PRN

## 2013-01-03 NOTE — Telephone Encounter (Signed)
Medication phoned to pharmacy.  

## 2013-01-05 ENCOUNTER — Encounter: Payer: Self-pay | Admitting: Internal Medicine

## 2013-01-05 ENCOUNTER — Ambulatory Visit (INDEPENDENT_AMBULATORY_CARE_PROVIDER_SITE_OTHER): Payer: Managed Care, Other (non HMO) | Admitting: Psychology

## 2013-01-05 DIAGNOSIS — F331 Major depressive disorder, recurrent, moderate: Secondary | ICD-10-CM

## 2013-01-06 ENCOUNTER — Other Ambulatory Visit: Payer: Self-pay | Admitting: Family Medicine

## 2013-01-06 ENCOUNTER — Ambulatory Visit (INDEPENDENT_AMBULATORY_CARE_PROVIDER_SITE_OTHER): Payer: Managed Care, Other (non HMO) | Admitting: General Surgery

## 2013-01-06 ENCOUNTER — Encounter: Payer: Self-pay | Admitting: Family Medicine

## 2013-01-06 ENCOUNTER — Encounter (INDEPENDENT_AMBULATORY_CARE_PROVIDER_SITE_OTHER): Payer: Self-pay | Admitting: General Surgery

## 2013-01-06 VITALS — BP 92/70 | HR 98 | Temp 98.2°F | Resp 16 | Ht 73.0 in | Wt 193.8 lb

## 2013-01-06 DIAGNOSIS — K4090 Unilateral inguinal hernia, without obstruction or gangrene, not specified as recurrent: Secondary | ICD-10-CM

## 2013-01-06 MED ORDER — GABAPENTIN 300 MG PO CAPS: 300.0000 mg | ORAL_CAPSULE | Freq: Three times a day (TID) | ORAL | Status: DC

## 2013-01-06 NOTE — Progress Notes (Signed)
Patient ID: Luke [name], male   DOB: [birth year], 42 y.o.   MRN: [id number] The patient is a 42 year old male status post laparoscopic left inguinal hernia repair with mesh. The patient states he continued having left inguinal pain that radiates down his testicle. He states that he notices as often as well as when he sits down. The patient was recently admitted to the hospital secondary to withdrawal protocol. He is currently off all narcotic medication.  On exam: There is no hernia on palpation. Patient does have tenderness to his left testicle.  Assessment and plan: 1. Again the patient prescription for Neurontin. Should the patient not have relief in the next 2-3 weeks patient a call we will increase the dose. The patient elects daily for many narcotic medication, and has an appointment with pain physician in August. 2. We'll outpatient follow back up in approximately a month to reevaluate his inguinal pain.

## 2013-01-07 ENCOUNTER — Encounter: Payer: Self-pay | Admitting: Family Medicine

## 2013-01-07 ENCOUNTER — Ambulatory Visit (INDEPENDENT_AMBULATORY_CARE_PROVIDER_SITE_OTHER): Payer: Managed Care, Other (non HMO) | Admitting: Family Medicine

## 2013-01-07 VITALS — BP 102/72 | HR 76 | Temp 98.2°F | Wt 196.2 lb

## 2013-01-07 DIAGNOSIS — M1710 Unilateral primary osteoarthritis, unspecified knee: Secondary | ICD-10-CM

## 2013-01-07 DIAGNOSIS — E291 Testicular hypofunction: Secondary | ICD-10-CM

## 2013-01-07 DIAGNOSIS — M170 Bilateral primary osteoarthritis of knee: Secondary | ICD-10-CM

## 2013-01-07 NOTE — Progress Notes (Signed)
Fatigued, dec in libido.  Prev was on T patches with some relief.  Has been off treatment for years.  Sx returned in meantime.  Recently with normal hgb and TSH.  Hasn't had T level checked recently.  FH prostate cancer noted.  He has trouble with patches sticking, ie with other meds.    He had his knee injected and referral to pain clinic is pending, he's due for eval in 8/14.   He wanted to change to me as his PCP.  I told him I would need to d/w his PCP first.   He was started on gabapentin for pain at the prev hernia repair site.   PMH and SH reviewed  ROS: See HPI, otherwise noncontributory.  Meds, vitals, and allergies reviewed.   nad ncat Mmm rrr ctab abd soft, not ttp Ext w/o edema

## 2013-01-07 NOTE — Patient Instructions (Signed)
Come back for early AM labs (not fasting).  We'll go from there.  Take care.

## 2013-01-09 DIAGNOSIS — E291 Testicular hypofunction: Secondary | ICD-10-CM | POA: Insufficient documentation

## 2013-01-09 NOTE — Assessment & Plan Note (Addendum)
D/w pt about risk and benefit, including prostate CA risk.  Return for labs.  If T is low, will likely refer to uro given FH prostate CA.  He understood. >25 min spent with face to face with patient, >50% counseling and/or coordinating care.

## 2013-01-09 NOTE — Assessment & Plan Note (Signed)
With pain clinic eval pending.

## 2013-01-10 ENCOUNTER — Other Ambulatory Visit (INDEPENDENT_AMBULATORY_CARE_PROVIDER_SITE_OTHER): Payer: Managed Care, Other (non HMO)

## 2013-01-10 ENCOUNTER — Encounter: Payer: Self-pay | Admitting: Family Medicine

## 2013-01-10 ENCOUNTER — Other Ambulatory Visit: Payer: Self-pay | Admitting: Family Medicine

## 2013-01-10 DIAGNOSIS — E291 Testicular hypofunction: Secondary | ICD-10-CM

## 2013-01-10 LAB — LUTEINIZING HORMONE: LH: 9.25 m[IU]/mL (ref 1.50–9.30)

## 2013-01-11 ENCOUNTER — Telehealth: Payer: Self-pay | Admitting: Family Medicine

## 2013-01-11 NOTE — Telephone Encounter (Signed)
Patient called in, Lyla Son advised. Appointment scheduled.

## 2013-01-11 NOTE — Telephone Encounter (Signed)
Call pt.  See notes on labs.  I talked to Dr. Alphonsus Sias.  I wouldn't change PCPs yet.  Needs OV.  Let me see him and check his knee and see if there is any other thing that I can come up with to help with the pain.  Thanks.

## 2013-01-12 ENCOUNTER — Ambulatory Visit (INDEPENDENT_AMBULATORY_CARE_PROVIDER_SITE_OTHER): Payer: Managed Care, Other (non HMO) | Admitting: Psychology

## 2013-01-12 DIAGNOSIS — F331 Major depressive disorder, recurrent, moderate: Secondary | ICD-10-CM

## 2013-01-13 ENCOUNTER — Ambulatory Visit (INDEPENDENT_AMBULATORY_CARE_PROVIDER_SITE_OTHER)
Admission: RE | Admit: 2013-01-13 | Discharge: 2013-01-13 | Disposition: A | Discharge status: Home / Self Care | Payer: Managed Care, Other (non HMO) | Source: Ambulatory Visit | Attending: Family Medicine | Admitting: Family Medicine

## 2013-01-13 ENCOUNTER — Encounter: Payer: Self-pay | Admitting: Family Medicine

## 2013-01-13 ENCOUNTER — Ambulatory Visit (INDEPENDENT_AMBULATORY_CARE_PROVIDER_SITE_OTHER): Payer: Managed Care, Other (non HMO) | Admitting: Family Medicine

## 2013-01-13 VITALS — BP 112/80 | HR 76 | Temp 98.1°F | Wt 204.5 lb

## 2013-01-13 DIAGNOSIS — M25569 Pain in unspecified knee: Secondary | ICD-10-CM

## 2013-01-13 MED ORDER — DICLOFENAC SODIUM 1 % TD GEL: 4.0000 g | Freq: Four times a day (QID) | TRANSDERMAL | Status: AC

## 2013-01-13 MED ORDER — DULOXETINE HCL 30 MG PO CPEP: 90.0000 mg | ORAL_CAPSULE | Freq: Every day | ORAL | Status: DC

## 2013-01-13 NOTE — Progress Notes (Signed)
B, L>R, knee pain continues.  No recent trauma.  Mult prev surgeries noted.  Medial pain.  No locking now, but did have before meniscus surgery.  Prev injected.  Would like to avoid opiates.  Taking nsaids already.  Asking about options.  F/u with pain clinic pending.  Meds, vitals, and allergies reviewed.   ROS: See HPI.  Otherwise, noncontributory.  nad Walks with limp B knee with no erythema, B medial joint line ttp. Crepitus on ROM B.  ACL MCL LCL solid on testing B, no meniscal click B

## 2013-01-13 NOTE — Patient Instructions (Addendum)
Try the cymbalta at 90 mg.  You can stop the ibuprofen and try the voltaren gel.  If that doesn't help, then try capsaicin gel.  Take care.

## 2013-01-13 NOTE — Assessment & Plan Note (Signed)
Can try capsaicin, can hold oral nsaids and try voltaren gel, can try inc to 90 of cymbalta for now.  He'll report back prn.  Pain clinic f/u pending.  xrays reviewed and d/w pt.  >25 min spent with face to face with patient, >50% counseling and/or coordinating care

## 2013-01-19 ENCOUNTER — Ambulatory Visit (INDEPENDENT_AMBULATORY_CARE_PROVIDER_SITE_OTHER): Payer: Managed Care, Other (non HMO) | Admitting: Psychology

## 2013-01-19 DIAGNOSIS — F331 Major depressive disorder, recurrent, moderate: Secondary | ICD-10-CM

## 2013-01-26 ENCOUNTER — Ambulatory Visit (INDEPENDENT_AMBULATORY_CARE_PROVIDER_SITE_OTHER): Payer: Managed Care, Other (non HMO) | Admitting: Psychology

## 2013-01-26 DIAGNOSIS — F331 Major depressive disorder, recurrent, moderate: Secondary | ICD-10-CM

## 2013-02-02 ENCOUNTER — Ambulatory Visit: Payer: Managed Care, Other (non HMO) | Admitting: Psychology

## 2013-02-03 ENCOUNTER — Encounter (INDEPENDENT_AMBULATORY_CARE_PROVIDER_SITE_OTHER): Payer: Managed Care, Other (non HMO) | Admitting: General Surgery

## 2013-02-09 ENCOUNTER — Ambulatory Visit: Payer: Managed Care, Other (non HMO) | Admitting: Psychology

## 2013-02-21 ENCOUNTER — Other Ambulatory Visit: Payer: Self-pay | Admitting: Family Medicine

## 2013-02-21 MED ORDER — ALPRAZOLAM 0.5 MG PO TABS: 0.5000 mg | ORAL_TABLET | Freq: Three times a day (TID) | ORAL | Status: DC | PRN

## 2013-02-21 NOTE — Telephone Encounter (Signed)
Dr. Dayton Martes not sure if you got this or not, ok to refill?

## 2013-02-21 NOTE — Telephone Encounter (Signed)
rx called into pharmacy

## 2013-02-23 ENCOUNTER — Ambulatory Visit (INDEPENDENT_AMBULATORY_CARE_PROVIDER_SITE_OTHER): Payer: Managed Care, Other (non HMO) | Admitting: Psychology

## 2013-02-23 DIAGNOSIS — F331 Major depressive disorder, recurrent, moderate: Secondary | ICD-10-CM

## 2013-02-28 ENCOUNTER — Encounter: Payer: Self-pay | Admitting: Internal Medicine

## 2013-02-28 ENCOUNTER — Ambulatory Visit (INDEPENDENT_AMBULATORY_CARE_PROVIDER_SITE_OTHER): Payer: Managed Care, Other (non HMO) | Admitting: Internal Medicine

## 2013-02-28 VITALS — BP 140/80 | HR 115 | Temp 98.1°F | Ht 73.0 in | Wt 202.0 lb

## 2013-02-28 DIAGNOSIS — Z0000 Encounter for general adult medical examination without abnormal findings: Secondary | ICD-10-CM

## 2013-02-28 DIAGNOSIS — M25569 Pain in unspecified knee: Secondary | ICD-10-CM

## 2013-02-28 DIAGNOSIS — M25562 Pain in left knee: Secondary | ICD-10-CM

## 2013-02-28 DIAGNOSIS — R1032 Left lower quadrant pain: Secondary | ICD-10-CM

## 2013-02-28 DIAGNOSIS — F339 Major depressive disorder, recurrent, unspecified: Secondary | ICD-10-CM

## 2013-02-28 NOTE — Progress Notes (Signed)
Subjective:    Patient ID: Luke [name], male    DOB: 12-18-1970, 42 y.o.   MRN: [id number]  HPI Here for physical  Knee pain continues Using naproxen and tylenol voltaren and lidocane topically Able to work and doing okay without the narcotics  Now on androgel Feels his energy levels are some better Due for repeat labs soon  Mood has been better On the cymbalta Gabapentin from nerve pain since the hernia repair Going for counseling with Dr Laymond Purser still Still separated from wife. Spends time with her in [place] but not with the kids. Hopes she will come around  Current Outpatient Prescriptions on File Prior to Visit  Medication Sig Dispense Refill  . ALPRAZolam (XANAX) 0.5 MG tablet Take 1 tablet (0.5 mg total) by mouth 3 (three) times daily as needed for anxiety (panic attack ).  60 tablet  0  . aspirin 81 MG tablet Take 81 mg by mouth daily.        . Coenzyme Q10 (CO Q 10) 100 MG CAPS Take 1 capsule by mouth daily.      . diclofenac sodium (VOLTAREN) 1 % GEL Apply 4 g topically 4 (four) times daily.  100 g  2  . DULoxetine (CYMBALTA) 30 MG capsule Take 3 capsules (90 mg total) by mouth daily.      Marland Kitchen gabapentin (NEURONTIN) 300 MG capsule Take 1 capsule (300 mg total) by mouth 3 (three) times daily.  90 capsule  1  . lidocaine (LIDODERM) 5 % Place 1 patch onto the skin daily. Remove & Discard patch within 12 hours or as directed by MD  30 patch  5  . MULTIPLE VITAMINS PO Take 2 tablets by mouth daily.      Marland Kitchen PARoxetine (PAXIL) 40 MG tablet Take 1 tablet (40 mg total) by mouth daily.  30 tablet  11  . vitamin B-12 (CYANOCOBALAMIN) 1000 MCG tablet Take 1,000 mcg by mouth daily.         No current facility-administered medications on file prior to visit.    Allergies  Allergen Reactions  . Buspirone Hcl     REACTION: unspecified  . Fluoxetine Hcl     REACTION: unspecified  . Olanzapine     REACTION: unspecified  . Tramadol   . Hydrocodone     Makes him irritable     Past Medical History  Diagnosis Date  . Depression   . Anxiety   . OA (osteoarthritis)   . Adenomatous colon polyp   . Inguinal hernia     Past Surgical History  Procedure Laterality Date  . Appendectomy  1983  . Inguinal hernia repair  1992    right  . Wrist surgery  01/2007    right  . Knee arthroscopy  ~1995 and 2005    Right  . Knee arthroscopy  10/2008    Left ([place])  . Laparoscopic inguinal hernia repair  10/13    left    Family History  Problem Relation Age of Onset  . Cancer Father     prostate  . Lymphoma Brother     Hodgkin's  . Hodgkin's lymphoma Brother   . Cancer Paternal Uncle     colon  . Colon polyps Paternal Uncle   . Heart attack Paternal Grandfather     ??  . Esophageal cancer Neg Hx   . Rectal cancer Neg Hx   . Stomach cancer Neg Hx     History   Social History  . Marital  Status: Married    Spouse Name: N/A    Number of Children: 1  . 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 Smoking status: Never Smoker   . Smokeless tobacco: Never Used  . Alcohol Use: No  . Drug Use: No  . Sexually Active: Not on file   Other Topics Concern  . Not on file   Social History Narrative   Divorced then remarried 2010   Has son and now daughter with second wife   Review of Systems  Constitutional: Negative for unexpected weight change.       Trying to work out at [employer] Wears seat belt   HENT: Positive for dental problem. Negative for hearing loss, congestion, rhinorrhea and tinnitus.        Overdue with dentist  Eyes: Negative for visual disturbance.       No diplopia or unilateral vision loss  Respiratory: Negative for cough, chest tightness and shortness of breath.   Cardiovascular: Positive for palpitations. Negative for chest pain and leg swelling.       Heart rate still elevated  Gastrointestinal: Positive for abdominal pain and diarrhea. Negative  for nausea, vomiting and blood in stool.       Has bouts of loose stools--- following some stomach upset  Endocrine: Negative for cold intolerance and heat intolerance.  Genitourinary: Negative for urgency, frequency and difficulty urinating.  Musculoskeletal: Positive for joint swelling and arthralgias. Negative for back pain.  Skin: Negative for rash.       No suspicious lesions  Allergic/Immunologic: Negative for environmental allergies and immunocompromised state.  Neurological: Positive for headaches. Negative for dizziness, syncope, weakness and light-headedness.  Hematological: Negative for adenopathy. Does not bruise/bleed easily.  Psychiatric/Behavioral: Positive for sleep disturbance and dysphoric mood. The patient is nervous/anxious.        Objective:   Physical Exam  Constitutional: He is oriented to person, place, and time. He appears well-developed and well-nourished. No distress.  HENT:  Head: Normocephalic and atraumatic.  Right Ear: External ear normal.  Left Ear: External ear normal.  Mouth/Throat: Oropharynx is clear and moist. No oropharyngeal exudate.  Eyes: Conjunctivae and EOM are normal. Pupils are equal, round, and reactive to light.  Fundi normal ?slight alternating exotropia with close focusing  Neck: Normal range of motion. Neck supple. No thyromegaly present.  Cardiovascular: Regular rhythm, normal heart sounds and intact distal pulses.  Exam reveals no gallop.   No murmur heard. Initial rate 115 on check in---down to 96 for my exam  Pulmonary/Chest: Effort normal and breath sounds normal. No respiratory distress. He has no wheezes. He has no rales.  Abdominal: Soft. There is no tenderness.  Musculoskeletal: He exhibits no edema and no tenderness.  Lymphadenopathy:    He has no cervical adenopathy.  Neurological: He is alert and oriented to person, place, and time.  Skin: No rash noted. No erythema.  Psychiatric: He has a normal mood and affect. His  behavior is normal.          Assessment & Plan:

## 2013-02-28 NOTE — Assessment & Plan Note (Signed)
Ongoing stress with marriage, etc On meds and still with counseling

## 2013-02-28 NOTE — Assessment & Plan Note (Signed)
Did have PSA before testosterone Rx Not due for any other preventative services

## 2013-02-28 NOTE — Assessment & Plan Note (Signed)
Felt to be from nerve On the gabapentin

## 2013-02-28 NOTE — Assessment & Plan Note (Signed)
Chronic Now dealing with it without the narcotics

## 2013-03-02 ENCOUNTER — Ambulatory Visit (INDEPENDENT_AMBULATORY_CARE_PROVIDER_SITE_OTHER): Payer: Managed Care, Other (non HMO) | Admitting: Psychology

## 2013-03-02 DIAGNOSIS — F331 Major depressive disorder, recurrent, moderate: Secondary | ICD-10-CM

## 2013-03-09 ENCOUNTER — Ambulatory Visit (INDEPENDENT_AMBULATORY_CARE_PROVIDER_SITE_OTHER): Payer: Managed Care, Other (non HMO) | Admitting: Psychology

## 2013-03-09 DIAGNOSIS — F331 Major depressive disorder, recurrent, moderate: Secondary | ICD-10-CM

## 2013-03-16 ENCOUNTER — Ambulatory Visit: Payer: Managed Care, Other (non HMO) | Admitting: Psychology

## 2013-03-18 ENCOUNTER — Encounter: Payer: Self-pay | Admitting: Internal Medicine

## 2013-03-18 ENCOUNTER — Ambulatory Visit (INDEPENDENT_AMBULATORY_CARE_PROVIDER_SITE_OTHER): Payer: Managed Care, Other (non HMO) | Admitting: Psychology

## 2013-03-18 DIAGNOSIS — F331 Major depressive disorder, recurrent, moderate: Secondary | ICD-10-CM

## 2013-03-21 DIAGNOSIS — I6783 Posterior reversible encephalopathy syndrome: Secondary | ICD-10-CM

## 2013-03-21 HISTORY — DX: Posterior reversible encephalopathy syndrome: I67.83

## 2013-03-22 ENCOUNTER — Ambulatory Visit: Payer: Managed Care, Other (non HMO) | Admitting: Psychology

## 2013-03-22 ENCOUNTER — Other Ambulatory Visit: Payer: Self-pay | Admitting: Family Medicine

## 2013-03-22 NOTE — Telephone Encounter (Signed)
Ok to refill 

## 2013-03-23 ENCOUNTER — Ambulatory Visit (INDEPENDENT_AMBULATORY_CARE_PROVIDER_SITE_OTHER): Payer: Managed Care, Other (non HMO) | Admitting: Psychology

## 2013-03-23 DIAGNOSIS — F331 Major depressive disorder, recurrent, moderate: Secondary | ICD-10-CM

## 2013-03-23 MED ORDER — ALPRAZOLAM 0.5 MG PO TABS: 0.5000 mg | ORAL_TABLET | Freq: Three times a day (TID) | ORAL | Status: AC | PRN

## 2013-03-23 NOTE — Telephone Encounter (Signed)
Phoned in to pharmacy. 

## 2013-03-23 NOTE — Telephone Encounter (Signed)
Okay #60 x 0 

## 2013-03-25 ENCOUNTER — Inpatient Hospital Stay: Payer: Self-pay | Admitting: Internal Medicine

## 2013-03-25 LAB — APTT: Activated PTT: 29.5 secs (ref 23.6–35.9)

## 2013-03-25 LAB — URINALYSIS, COMPLETE
Bilirubin,UR: NEGATIVE
Blood: NEGATIVE
Leukocyte Esterase: NEGATIVE
Ph: 6 (ref 4.5–8.0)
Protein: NEGATIVE
RBC,UR: 5 /HPF (ref 0–5)
Specific Gravity: 1.015 (ref 1.003–1.030)
Squamous Epithelial: <1
WBC UR: 10 /HPF (ref 0–5)

## 2013-03-25 LAB — CBC WITH DIFFERENTIAL/PLATELET
Basophil %: 0.5 %
Eosinophil #: 0 10*3/uL (ref 0.0–0.7)
Eosinophil %: 0.1 %
Eosinophil: 2 %
HCT: 45 % (ref 40.0–52.0)
HCT: 38.4 % — ABNORMAL LOW (ref 40.0–52.0)
HGB: 13 g/dL (ref 13.0–18.0)
Lymphocyte #: 2.3 10*3/uL (ref 1.0–3.6)
MCH: 30.2 pg (ref 26.0–34.0)
MCHC: 31.6 g/dL — ABNORMAL LOW (ref 32.0–36.0)
MCHC: 33.9 g/dL (ref 32.0–36.0)
MCV: 96 fL (ref 80–100)
MCV: 89 fL (ref 80–100)
Monocyte #: 1.4 x10 3/mm — ABNORMAL HIGH (ref 0.2–1.0)
Platelet: 321 10*3/uL (ref 150–440)
RBC: 4.32 10*6/uL — ABNORMAL LOW (ref 4.40–5.90)
Segmented Neutrophils: 50 %
WBC: 19.8 10*3/uL — ABNORMAL HIGH (ref 3.8–10.6)

## 2013-03-25 LAB — COMPREHENSIVE METABOLIC PANEL
Albumin: 4.3 g/dL (ref 3.4–5.0)
Alkaline Phosphatase: 87 U/L (ref 50–136)
Anion Gap: 10 (ref 7–16)
Calcium, Total: 9 mg/dL (ref 8.5–10.1)
Co2: 27 mmol/L (ref 21–32)
Potassium: 3.7 mmol/L (ref 3.5–5.1)
SGOT(AST): 141 U/L — ABNORMAL HIGH (ref 15–37)
SGPT (ALT): 108 U/L — ABNORMAL HIGH (ref 12–78)
Sodium: 135 mmol/L — ABNORMAL LOW (ref 136–145)
Total Protein: 7.8 g/dL (ref 6.4–8.2)

## 2013-03-25 LAB — MAGNESIUM: Magnesium: 3.5 mg/dL — ABNORMAL HIGH

## 2013-03-25 LAB — SALICYLATE LEVEL: Salicylates, Serum: <1.7 mg/dL

## 2013-03-25 LAB — DRUG SCREEN, URINE
Amphetamines, Ur Screen: NEGATIVE (ref ?–1000)
MDMA (Ecstasy)Ur Screen: NEGATIVE (ref ?–500)
Methadone, Ur Screen: NEGATIVE (ref ?–300)
Opiate, Ur Screen: NEGATIVE (ref ?–300)
Phencyclidine (PCP) Ur S: NEGATIVE (ref ?–25)
Tricyclic, Ur Screen: NEGATIVE (ref ?–1000)

## 2013-03-25 LAB — PROTIME-INR: INR: 1.1

## 2013-03-25 LAB — PHOSPHORUS: Phosphorus: 13.4 mg/dL — ABNORMAL HIGH (ref 2.5–4.9)

## 2013-03-25 LAB — ACETAMINOPHEN LEVEL: Acetaminophen: <2 ug/mL

## 2013-03-26 LAB — URINE CULTURE

## 2013-03-28 ENCOUNTER — Telehealth: Payer: Self-pay

## 2013-03-28 NOTE — Telephone Encounter (Signed)
Luke [name], pts mother said pt was found unconscious in his car on 03/25/13; was admitted to [facility] with kidney failure, pt was on ventilator over weekend, but ventilator has been removed and pt is breathing on his own. Pt is more alert but eye specialist to see him today,pts vision is extremely blurred. [name] just wanted you to know.

## 2013-03-28 NOTE — Telephone Encounter (Signed)
Discussed situation with his mom Still not sure what happened but he is off life support, talking, eating and apparently his kidneys are improved. Further evaluation is ongoing at [facility]

## 2013-03-28 NOTE — Telephone Encounter (Signed)
I called, he is still inpatient. Per report MRI and EEG was done.  I will await the notes from [facility].  They thanked me for the call.

## 2013-03-30 ENCOUNTER — Ambulatory Visit: Payer: Managed Care, Other (non HMO) | Admitting: Psychology

## 2013-03-31 LAB — CULTURE, BLOOD (SINGLE)

## 2013-04-05 ENCOUNTER — Telehealth: Payer: Self-pay

## 2013-04-05 NOTE — Telephone Encounter (Signed)
[name] pts mother pt said pt has been discharged from [facility] on 03/30/13'; [name] said pt has been OK until today and pt does not know where he is, not acting right, h/a, pt feels hot but to touch pts body he feels cold and pt is shaking. Pt is urinating OK. [name] concerned about confusion and pt having h/a. Pt is sleepy but is afraid to go to sleep for fear he will not wake up.Please advise. [name] does not think pt needs to go to ED

## 2013-04-05 NOTE — Telephone Encounter (Signed)
I called her.  He is in route to [facility] and I'll await details.  I offered my support.  She thanked me.

## 2013-04-05 NOTE — Telephone Encounter (Signed)
Dr Para March said with PRES and now with mental confusion and h/a pt needs to go to ED for eval. [name] voiced understanding and request Dr Para March to call [name] at [phone] when he can.

## 2013-04-06 ENCOUNTER — Ambulatory Visit (INDEPENDENT_AMBULATORY_CARE_PROVIDER_SITE_OTHER): Payer: Managed Care, Other (non HMO) | Admitting: Internal Medicine

## 2013-04-06 ENCOUNTER — Telehealth: Payer: Self-pay | Admitting: Family Medicine

## 2013-04-06 ENCOUNTER — Encounter: Payer: Self-pay | Admitting: Internal Medicine

## 2013-04-06 ENCOUNTER — Ambulatory Visit: Payer: Managed Care, Other (non HMO) | Admitting: Psychology

## 2013-04-06 VITALS — BP 110/78 | HR 92 | Temp 98.5°F | Wt 193.0 lb

## 2013-04-06 DIAGNOSIS — G9349 Other encephalopathy: Secondary | ICD-10-CM

## 2013-04-06 DIAGNOSIS — I6783 Posterior reversible encephalopathy syndrome: Secondary | ICD-10-CM | POA: Insufficient documentation

## 2013-04-06 NOTE — Assessment & Plan Note (Signed)
Vague syndrome which is supposed to be reversible Still disabled Has had some visual hallucinations related to the vision loss  Will just have to wait BP is fine now

## 2013-04-06 NOTE — Telephone Encounter (Signed)
I called pt's listed phone number.  A coworker answered. The coworker didn't have another number readily available for the patient.  The coworker knew that the patient had been ill. I asked him to pass along that I wished him well. I didn't leave any confidential information. His coworker said he would pass along the message and thanked me for the call.

## 2013-04-06 NOTE — Progress Notes (Signed)
Subjective:    Patient ID: Luke [name], male    DOB: February 04, 1971, 42 y.o.   MRN: [id number]  HPI Here with wife  Hospitalized after loss of conciousness Sent to [facility] Diagnosed with PRES Carvedilol started to keep BP down Got Rx for lorazepam but hasn't taken----still uses the alprazolam  Still can't see well Hasn't been able to work Time course could be weeks (2-4?)  Yesterday, was talking to GM and got confused about where he was Felt like he was in a dream Headache started again--seemed to be nodding off in mid conversation EMS came--back to [facility] CT repeated--no bleeding  Current Outpatient Prescriptions on File Prior to Visit  Medication Sig Dispense Refill  . ALPRAZolam (XANAX) 0.5 MG tablet Take 1 tablet (0.5 mg total) by mouth 3 (three) times daily as needed for anxiety (panic attack ).  60 tablet  0  . aspirin 81 MG tablet Take 81 mg by mouth daily.        . Coenzyme Q10 (CO Q 10) 100 MG CAPS Take 1 capsule by mouth daily.      . diclofenac sodium (VOLTAREN) 1 % GEL Apply 4 g topically 4 (four) times daily.  100 g  2  . MULTIPLE VITAMINS PO Take 2 tablets by mouth daily.      . vitamin B-12 (CYANOCOBALAMIN) 1000 MCG tablet Take 1,000 mcg by mouth daily.         No current facility-administered medications on file prior to visit.    Allergies  Allergen Reactions  . Buspirone Hcl     REACTION: unspecified  . Fluoxetine Hcl     REACTION: unspecified  . Olanzapine     REACTION: unspecified  . Tramadol   . Hydrocodone     Makes him irritable    Past Medical History  Diagnosis Date  . Depression   . Anxiety   . OA (osteoarthritis)   . Adenomatous colon polyp   . Inguinal hernia   . 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    Past Surgical History  Procedure Laterality Date  . Appendectomy  1983  . Inguinal hernia repair  1992    right  . Wrist surgery  01/2007    right  . Knee arthroscopy  ~1995 and 2005    Right  .  Knee arthroscopy  10/2008    Left ([place])  . Laparoscopic inguinal hernia repair  10/13    left    Family History  Problem Relation Age of Onset  . Cancer Father     prostate  . Lymphoma Brother     Hodgkin's  . Hodgkin's lymphoma Brother   . Cancer Paternal Uncle     colon  . Colon polyps Paternal Uncle   . Heart attack Paternal Grandfather     ??  . Esophageal cancer Neg Hx   . Rectal cancer Neg Hx   . Stomach cancer Neg Hx     History   Social History  . Marital Status: Married    Spouse Name: N/A    Number of Children: 1  . 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 Smoking status: Never Smoker   . Smokeless tobacco: Never Used  . Alcohol Use: No  . Drug Use: No  . Sexual Activity: Not on file   Other Topics Concern  . Not on  file   Social History Narrative   Divorced then remarried 2010   Has son and now daughter with second wife   Review of Systems Eating okay Sleeping off and on Staying with wife now in [place]    Objective:   Physical Exam  Psychiatric:  Normal interaction Looks tired but not anxious          Assessment & Plan:

## 2013-04-07 ENCOUNTER — Telehealth: Payer: Self-pay | Admitting: Family Medicine

## 2013-04-07 ENCOUNTER — Ambulatory Visit: Payer: Managed Care, Other (non HMO) | Admitting: Internal Medicine

## 2013-04-13 ENCOUNTER — Ambulatory Visit: Payer: Managed Care, Other (non HMO) | Admitting: Psychology

## 2013-04-20 NOTE — Telephone Encounter (Signed)
Pt has passed away.  Please update chart.  Thanks.

## 2013-04-20 DEATH — deceased

## 2013-04-21 ENCOUNTER — Telehealth: Payer: Self-pay | Admitting: *Deleted

## 2013-04-21 NOTE — Telephone Encounter (Signed)
Spoke with Luke [name] and advised readings are ready for pick-up and I will leave them up front

## 2013-04-21 NOTE — Telephone Encounter (Signed)
Message copied by Sueanne Margarita on Thu Apr 21, 2013  4:37 PM ------      Message from: Tillman Abide I      Created: Thu Apr 21, 2013  3:13 PM      Regarding: RE: Phone call from pt's Mother       Please print up flow sheet with his blood pressures for her                  ----- Message -----         From: Randal Buba         Sent: 04/21/2013   2:41 PM           To: Karie Schwalbe, MD      Subject: Phone call from pt's Mother                              Good afternoon Dr. Alphonsus Sias,            Alveda Reasons called and says her son, Mordche Hedglin passed away in [facility] and the coroner there is ruling his death as a heart attack from hypertension.  The family is going to dispute this ruling in [place] but needs something from you which indicates his previous blood pressure readings.  The family is trying to prove that he did not have hypertension.  She says all she needs/wants is his b/p readings so they can show this.            She would like for you to call her if you have any questions, or once you have the readings available.  Her work # is [phone] or her cell is [phone].            Thank you,      Rose       ------

## 2013-08-31 ENCOUNTER — Ambulatory Visit: Payer: Managed Care, Other (non HMO) | Admitting: Internal Medicine

## 2014-04-08 IMAGING — CR DG KNEE COMPLETE 4+V*L*
5 series · 5 of 5 positions shown · non-contrast
Comparison: None.

CLINICAL DATA: Knee pain.

LEFT KNEE - COMPLETE 4+ VIEW

[view not recorded (1 of 5)]
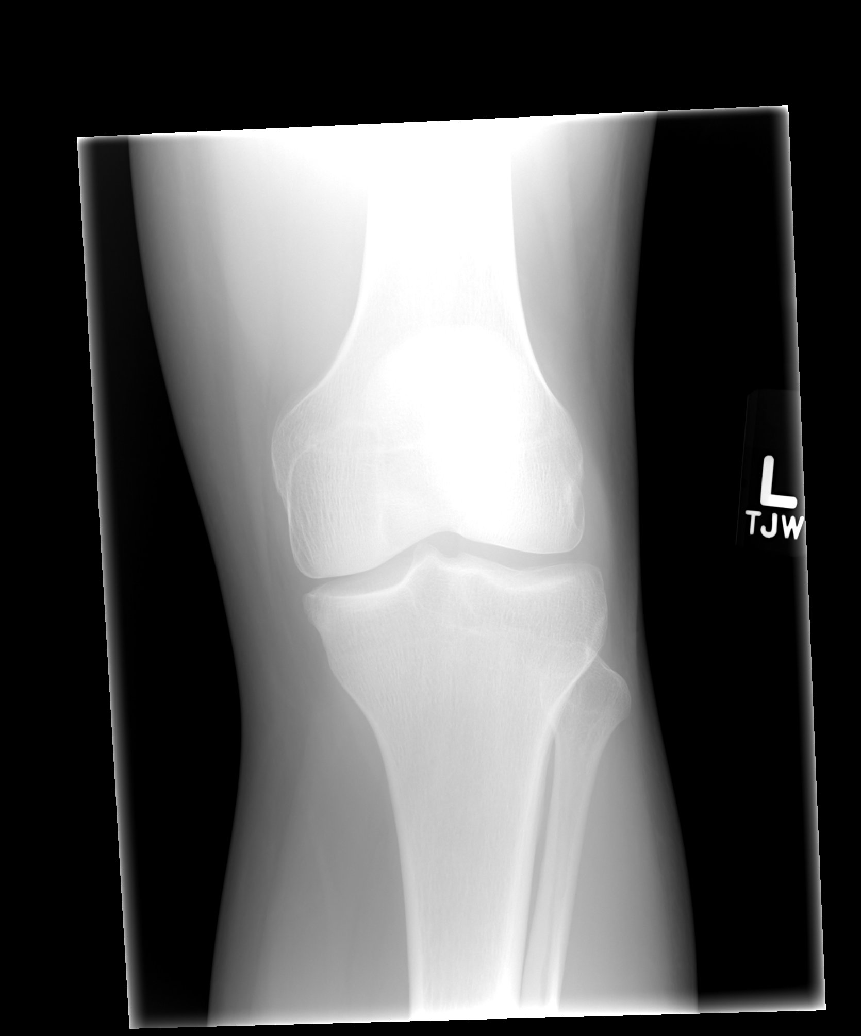

[view not recorded (2 of 5)]
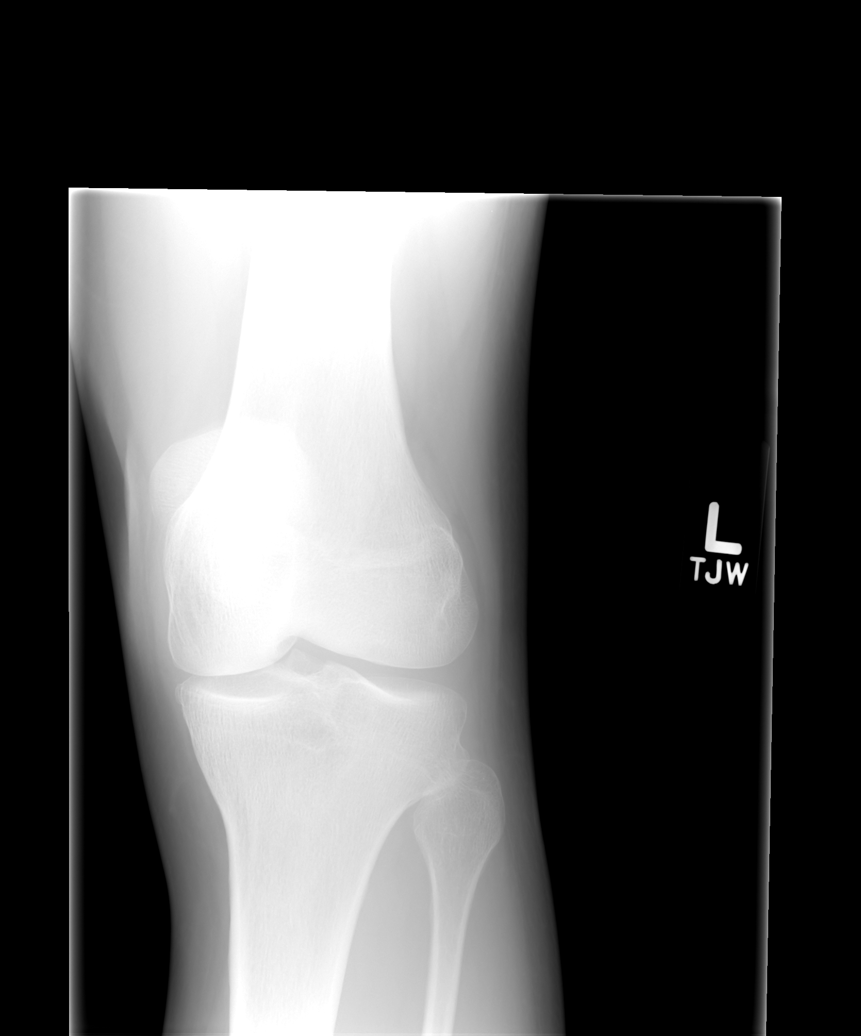

[view not recorded (3 of 5)]
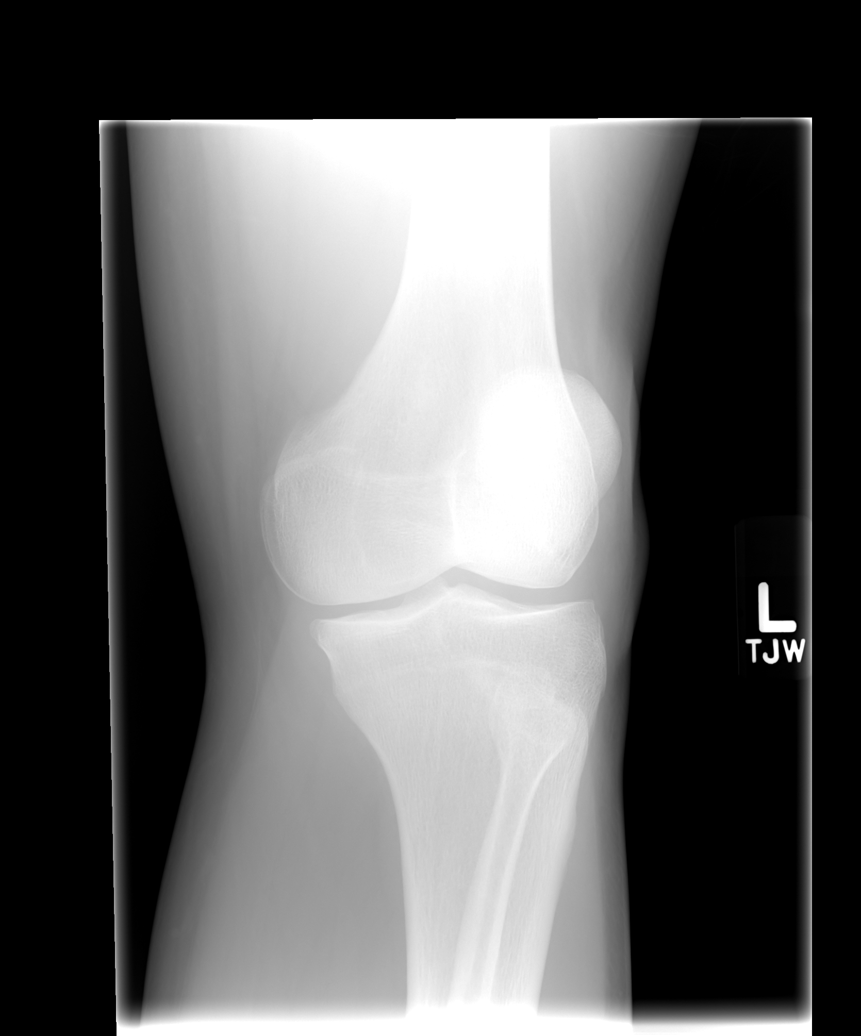

[view not recorded (4 of 5)]
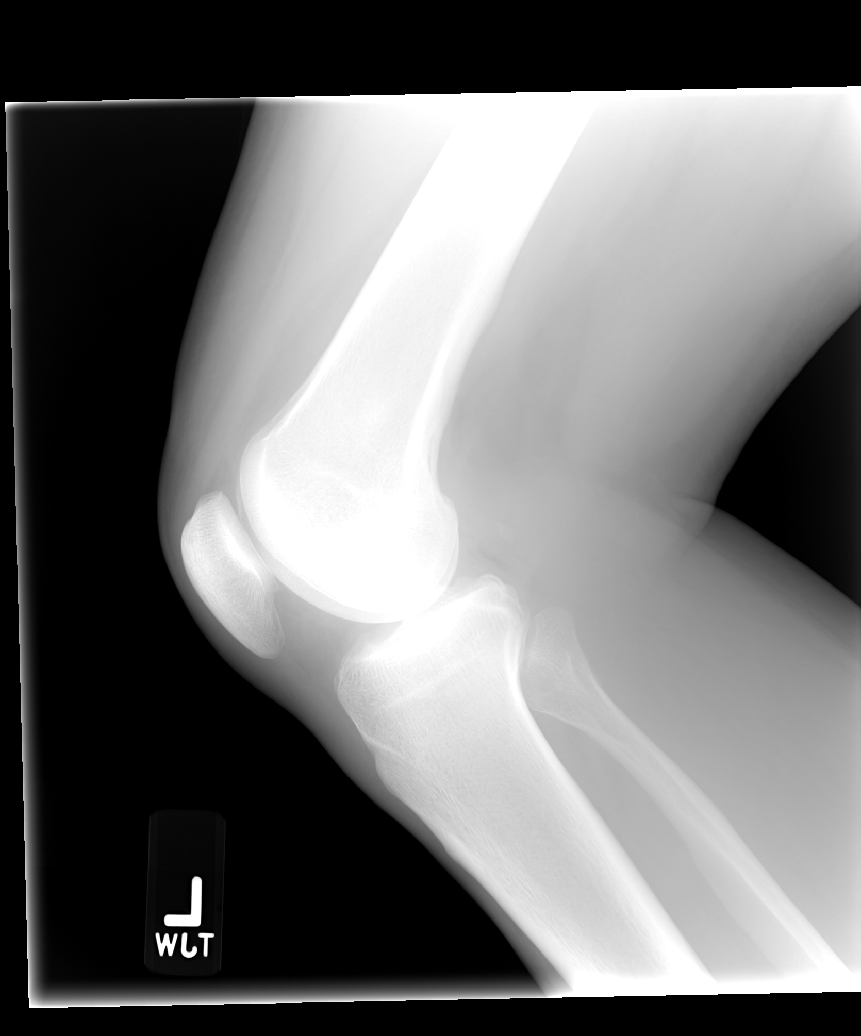

[view not recorded (5 of 5)]
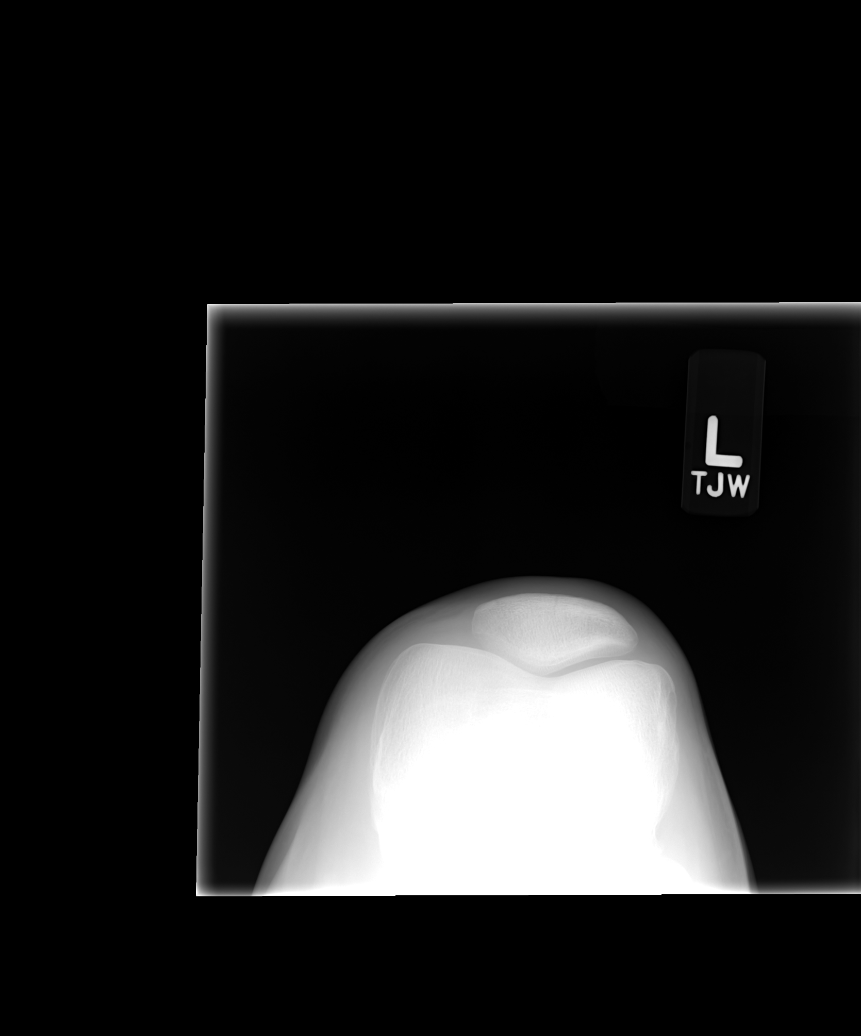

[5 of 5 positions shown; findings below may reference images not displayed]

FINDINGS: There is no fracture or dislocation.  Small joint
effusion.  Tiny marginal osteophytes on the superior aspect of the
patella and on the medial tibial plateau.
IMPRESSION: Minimal degenerative arthritic changes. Small joint effusion.

## 2014-06-18 IMAGING — CR DG CHEST 1V PORT
1 series · 1 of 1 positions shown · non-contrast
Comparison: none

REASON FOR EXAM: post-intubation
COMMENTS:

[ap]
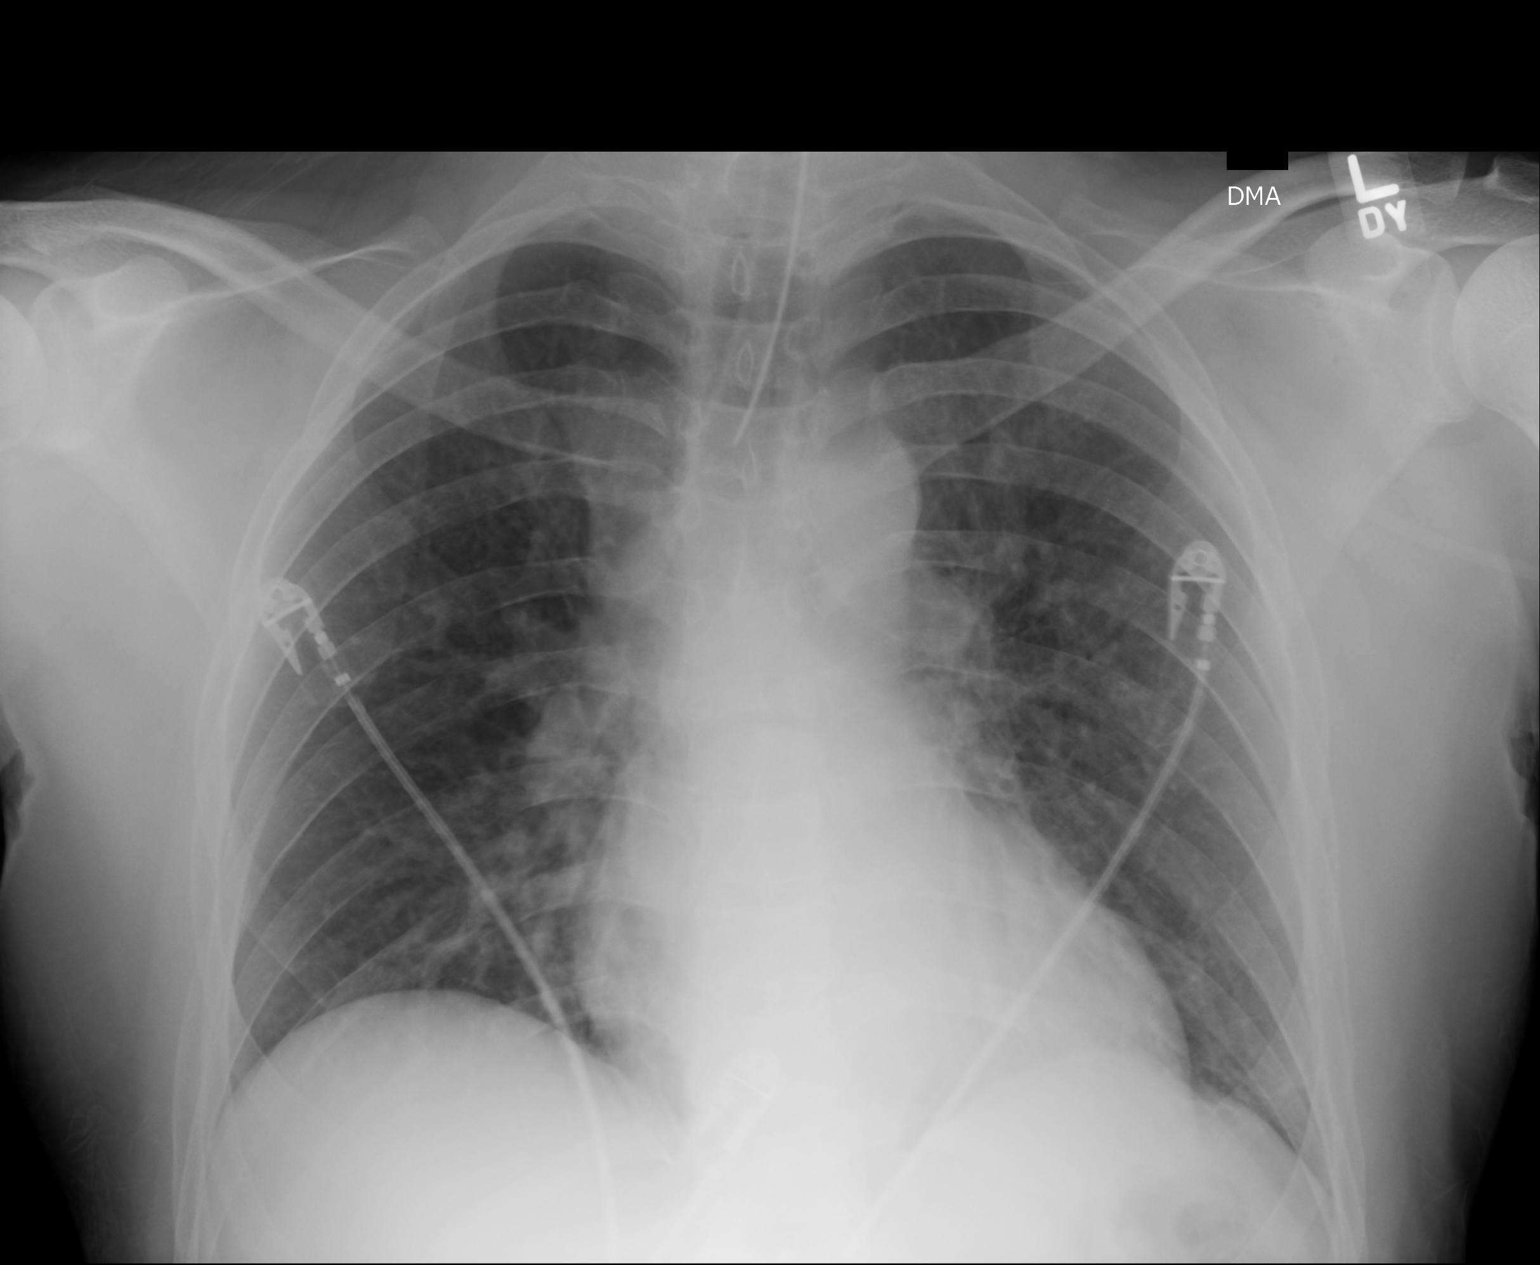

[1 of 1 positions shown; findings below may reference images not displayed]

PROCEDURE:     DXR - DXR PORTABLE CHEST SINGLE VIEW  - March 25, 2013  [DATE]

RESULT:     Endotracheal tube is present with the tip at the level of the
sternoclavicular joints. Cardiac monitoring electrodes are present. The
lungs are clear. The heart and pulmonary vessels are normal. The bony and
mediastinal structures are unremarkable. There is no effusion. There is no
pneumothorax or evidence of congestive failure.
IMPRESSION: No acute cardiopulmonary disease.

[REDACTED]

## 2014-11-10 NOTE — Discharge Summary (Signed)
PATIENT NAME:  Luke [name], Luke [name] MR#:  [id number] DATE OF BIRTH:  [birth year]  TRANSFER SUMMARY  DATE OF ADMISSION:  03/25/2013 DATE OF DISCHARGE:  03/26/2013  PRIMARY CARE PHYSICIAN: Dr. Silvio Pate.  DICTATING PHYSICIAN:  Demetrios Loll, MD  REASON FOR ADMISSION: The patient is a 44 year old Caucasian male admitted for coma, respiratory failure today. For detailed history and physical examination, please refer to the admission note dictated by me.   CURRENT IMPRESSIONS:  1.  Coma. 2.  Seizure.  3.  Acute respiratory failure.  4.  Acute renal failure.  5.  Metabolic acidosis.  6.  Hypotension.  7.  Leukocytosis.  8.  Hypothermia.  9.  Hyponatremia.  10.  Hypermagnesemia.  11.  Hyperphosphatemia.   HISTORY OF PRESENT ILLNESS: The patient is a 44 year old Caucasian male with a history of chronic pain, depression, anxiety, who was found unresponsive in the car today and was noted to have a seizure. Was intubated in the ED and placed on ventilation. The patient developed hypotension after intubation and was placed on Levophed drip. The patient is on sedation, on normal saline IV, bicarb drip.   In addition, the patient is treated with vancomycin, Zosyn. Blood culture was sent. I discussed the patient's critical condition with Dr. Mortimer Fries and Dr. Melrose Nakayama. Dr. Melrose Nakayama suggested the patient's altered mental status is possibly related to medications, but the patient may have a brain injury and need repeat CAT scan of head in 24 to 48 hours.   In addition, the patient needs an EEG.   The patient's family member requests the patient to be transferred to [facility]. I updated the patient's critical condition and the information with the patient's family member. I called [facility]'s transfer center, faxed face sheet.  Waiting for a call back.  CURRENT LABORATORIES: ABG shows pH 7.44, PO2 of 32, pCO2 of 32, pO2 of 120 with FiO2 of 40. Lactic acid 2.2. Troponin 0.5.   CAT scan of head shows no intracranial process.  Urinalysis negative. Urine drug screen is negative.   CHEST X-RAY: No acute cardiopulmonary disease.   WBC 19.8, hemoglobin 14.2, platelets 387, glucose 398, BUN 17, creatinine 2.13, sodium 135, potassium 3.7, chloride 98, bicarb 27, SGPT 108, SGOT 141, and magnesium 3.5. Phosphate 13.4. INR 1.1. CK 201. Acetaminophen less than 2.   Salicylates less than 1.7.   Pt has elevated troponin just now, possible demanding sschemia. We will start heparin drip.     ____________________________ Demetrios Loll, MD qc:np D: 03/25/2013 21:59:00 ET T: 03/25/2013 22:16:00 ET JOB#: [id number]  cc: Demetrios Loll, MD, <Dictator>   Demetrios Loll MD ELECTRONICALLY SIGNED 03/28/2013 12:58
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
## 2014-11-10 NOTE — Discharge Summary (Signed)
PATIENT NAME:  Luke [name], Luke [name] MR#:  [id number] DATE OF BIRTH:  20-Oct-1970  DATE OF ADMISSION:  12/16/2012 DATE OF DISCHARGE:  12/20/2012   HOSPITAL COURSE: See dictated history and physical for details of admission. This 44 year old man presented voluntarily to the hospital requesting to be detoxed from fentanyl. He had been using fentanyl patches prescribed by his doctor and despite gradually lowering the dose, he still felt that they were over sedating to him and did not adequately be address his pain anyway. He felt like he would be better off without narcotic pain medication. I discussed with the patient, the risks of doing this,  given that he would probably have a return of his pain, but he was still agreeable with tapering off of the narcotics. Fentanyl patches were not continued and instead, he was given Suboxone on a 2-1/2 day detox taper. He tolerated this medication well. He also had clonidine, Robaxin and Zofran available as p.r.n. The patient initially complained of feeling somewhat run down and having  opiate withdrawal symptoms, but he had improved by the time of discharge. On the day of discharge, his affect and mood upbeat. He said that his pain was moderate and tolerable. He denied craving opiates. He was offered the opportunity to consider inpatient rehab,  but reasonably the patient prefers to go back home and will follow up with his primary care doctor. He is also to try to make an appointment to see a pain specialist.   DISCHARGE MEDICATIONS: Alprazolam 0.25 twice a day, Cymbalta 60 mg a day, Naprosyn 250 mg twice a day, Paxil 10 mg a day, aspirin 81 mg a day, trazodone 50 mg at bedtime as needed sleep.   LABORATORY RESULTS: Admission labs showed a CBC with an anemia, hemoglobin 12.5, hematocrit 36.9. Chemistries normal except for a glucose elevated at 127 on a nonfasting draw. TSH normal at 1.0. Urinalysis unremarkable. Drug screen positive for opiates and benzodiazepines. Urinalysis  normal. Follow-up blood draws showed the anemia to have improved.   MENTAL STATUS EXAM AT DISCHARGE: Neatly dressed and groomed man who looks his stated age, cooperative with the interview. Good eye contact. Normal psychomotor activity. Speech normal in rate, tone and volume. Affect euthymic, reactive, appropriate. Mood stated as being fine. Thoughts are lucid. No evidence of loosening of associations or delusions. Denies auditory or visual hallucinations. Denies suicidal or homicidal ideation. Shows good judgment. Normal intelligence. Alert and oriented x 4.   DISPOSITION: The patient is discharged home with arrangements that he will be seen by Dr. (Dictation Anomaly) Dr Rexene Edison at the [facility] who is his primary care doctor.   DIAGNOSIS, PRINCIPAL AND PRIMARY:  AXIS I: Opiate dependence.   SECONDARY DIAGNOSES: AXIS I: Depression NOS.   AXIS II:  Deferred.   AXIS III: Chronic pain.   AXIS IV: Moderate from some social stress, financial problems.   AXIS V: Functioning at time of discharge 55.  ____________________________ Gonzella Lex, MD jtc:cc D: 12/20/2012 22:50:07 ET T: 12/20/2012 23:39:18 ET JOB#: [id number]  cc: Gonzella Lex, MD, <Dictator> Gonzella Lex MD ELECTRONICALLY SIGNED 12/22/2012 21:41
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
## 2014-11-10 NOTE — H&P (Signed)
PATIENT NAME:  Luke [name], Luke [name] MR#:  [id number] DATE OF BIRTH:  1970/08/30  DATE OF ADMISSION:  03/25/2013  PRIMARY CARE PHYSICIAN:  Dr. Silvio Pate.  REFERRING PHYSICIAN:  Dr. Jimmye Norman  CHIEF COMPLAINT:  Unresponsiveness today.   HISTORY OF PRESENT ILLNESS:  A 44 year old Caucasian male with a history of chronic pain, depression, anxiety, was sent to the ED due to unresponsiveness today. The patient was intubate in ED and unable to provide any information. According to the patient's wife and the patient's mother, the patient was fine yesterday. He drove a car, sent her daughter to daycare about 8:15 a.m. today and was found to be unresponsive in the car in daycare parking lot so the patient was sent to the ED for further evaluation. The patient's mother said the patient was on a fentanyl patch, but he was off the fentanyl patch for three months. The patient is on Cymbalta, Paxil, Xanax. The patient's drug screen is negative. Chest x-ray is negative. CAT scan of head is negative. The patient was intubated and started sedation. However, the patient's blood pressure dropped to 60s. We started Levophed drip.   PAST MEDICAL HISTORY:  Chronic pain, depression, anxiety, PTSD, panic attack.   ALLERGIES:  BUSPAR.  HOME MEDICATIONS:  1.  Vitamins B12 1000 mcg p.o. daily.  2.  Trazodone 50 mg p.o. daily at bedtime.  3.  P.r.n. Paroxetine 10 mg p.o. daily.  4.  Naprosyn 250 mg p.o. b.i.d.   5.  Lidoderm 5% topical film, apply topically to affected area p.r.n. 6.  Duloxetine 60 mg p.o. daily.  7.  CoQ-10 100 mg p.o. daily.  8.  Aspirin 81 mg p.o. daily.  9.  Alprazolam 0.05 mg p.o. b.i.d. for anxiety.   REVIEW OF SYSTEMS:  Unable to obtain.  SOCIAL HISTORY:  Unable to obtain.   SURGICAL HISTORY:  Unable to obtain.    According to previous document, the patient has no history of substance abuse or alcohol abuse. He is a self-employed Development worker, community.   PHYSICAL EXAMINATION:  VITAL SIGNS:  Temperature 94.8,  blood pressure 69/39, pulse 103, respirations 16 on ventilation.  GENERAL:  The patient is unresponsive on ventilation. Pupils are round, equal, reactive to light.  NECK:  Supple. No JVD or carotid bruits. No lymphadenopathy.  CARDIOVASCULAR:  S1, S2 regular rate and rhythm. No murmurs, gallops.  PULMONARY:  Bilateral air entry. No wheezing or rales. No use of accessory muscles to breathe.  ABDOMEN:  Soft. No distention or tenderness. No organomegaly. Bowel sounds present.  EXTREMITIES:  No edema, clubbing or cyanosis. Bilateral pedal pulses present.  SKIN:  No rash or jaundice.  NEUROLOGY:  The patient is unresponsive, unable to examine at this time.   LABORATORY DATA:  Lactic acid 6.9, ABG showed pH 6.97, pCO2 of 85, pO2 335, with FiO2 100.  CAT scan of the head is negative for intracranial process.   Urinalysis is negative.   Urine drug screen is negative.   CHEST X-RAY:  No acute cardiopulmonary disease.   WBC 19.8, hemoglobin 14.2, platelet 387, glucose 398, BUN 17, creatinine 2.13, sodium 135, potassium 3.7, chloride 98, magnesium 3.5, phosphate is 13.4. INR 1.1, CK 201, acetaminophen less than 2, salicylates less than 1.7. EKG showed normal sinus rhythm at 97 BPM.   IMPRESSION:  1.  Unresponsiveness, coma.  2.  Seizure, new onset.  3.  Acute respiratory failure.  4.  Acute renal failure.  5.  Metabolic acidosis.  6.  Hypotension.  7.  Leukocytosis, rule out sepsis.  8.  Hypothermia.  9.  Hyponatremia.   PLAN OF TREATMENT:  1.  The patient will be admitted to Critical Care Unit. We will continue telemonitor. We just started Levophed drip and continue ventilation management. I discussed the patient's critical condition with Dr. Mortimer Fries. He will see the patient.  2.  For seizure, we will continue sedation.  3.  For acute renal failure and metabolic acidosis, we will give normal saline IV and the bicarb drip and follow up a complete metabolic panel.  4.  For leukocytosis, we need  to rule out sepsis. We will follow up blood culture, CBC and start vancomycin and Zosyn.  5.  For seizure, we will start seizure precautions and place patient on sedation. I paged Dr. Melrose Nakayama, neurologist, am waiting for his response.  6.  We will hold the patient's psychiatric medication and pain medication. 7.  I discussed the patient's critical condition with the patient's wife, mother and other family member. Also discussed the patient's condition with Dr. Mortimer Fries.   CRITICAL TIME SPENT: About 75 minutes.  ____________________________ Demetrios Loll, MD qc:jm D: 03/25/2013 16:25:36 ET T: 03/25/2013 16:47:20 ET JOB#: [id number]  cc: Demetrios Loll, MD, <Dictator> Demetrios Loll MD ELECTRONICALLY SIGNED 03/26/2013 14:35
